# Patient Record
Sex: Female | Born: 1990 | Race: White | Hispanic: No | Marital: Single | Smoking: Never smoker
Health system: Southern US, Community
[De-identification: ages and names within clinical notes are randomized; demographics above are authoritative.]

## PROBLEM LIST (undated history)

## (undated) DIAGNOSIS — R011 Cardiac murmur, unspecified: Secondary | ICD-10-CM

## (undated) DIAGNOSIS — J45909 Unspecified asthma, uncomplicated: Secondary | ICD-10-CM

## (undated) DIAGNOSIS — R569 Unspecified convulsions: Secondary | ICD-10-CM

## (undated) DIAGNOSIS — G43909 Migraine, unspecified, not intractable, without status migrainosus: Secondary | ICD-10-CM

## (undated) HISTORY — DX: Cardiac murmur, unspecified: R01.1

## (undated) HISTORY — DX: Unspecified convulsions: R56.9

## (undated) HISTORY — DX: Unspecified asthma, uncomplicated: J45.909

## (undated) HISTORY — DX: Migraine, unspecified, not intractable, without status migrainosus: G43.909

---
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
## 2014-07-28 HISTORY — PX: WISDOM TOOTH EXTRACTION: SHX21

## 2016-01-07 DIAGNOSIS — H5213 Myopia, bilateral: Secondary | ICD-10-CM | POA: Diagnosis not present

## 2016-10-03 ENCOUNTER — Encounter: Payer: Self-pay | Admitting: Physician Assistant

## 2016-10-03 ENCOUNTER — Telehealth: Payer: Self-pay

## 2016-10-03 ENCOUNTER — Ambulatory Visit (INDEPENDENT_AMBULATORY_CARE_PROVIDER_SITE_OTHER): Payer: BLUE CROSS/BLUE SHIELD | Admitting: Physician Assistant

## 2016-10-03 VITALS — BP 115/72 | HR 88 | Ht 62.0 in | Wt 163.0 lb

## 2016-10-03 DIAGNOSIS — J4550 Severe persistent asthma, uncomplicated: Secondary | ICD-10-CM | POA: Diagnosis not present

## 2016-10-03 DIAGNOSIS — N926 Irregular menstruation, unspecified: Secondary | ICD-10-CM | POA: Diagnosis not present

## 2016-10-03 MED ORDER — FLUTICASONE PROPIONATE HFA 220 MCG/ACT IN AERO: 2.0000 | INHALATION_SPRAY | Freq: Two times a day (BID) | RESPIRATORY_TRACT | 3 refills | Status: DC

## 2016-10-03 MED ORDER — MONTELUKAST SODIUM 10 MG PO TABS: 10.0000 mg | ORAL_TABLET | Freq: Every day | ORAL | 3 refills | Status: DC

## 2016-10-03 MED ORDER — LEVALBUTEROL HCL 0.63 MG/3ML IN NEBU
0.6300 mg | INHALATION_SOLUTION | Freq: Four times a day (QID) | RESPIRATORY_TRACT | 3 refills | Status: DC | PRN
Start: 2016-10-03 — End: 2018-03-22

## 2016-10-03 MED ORDER — CYCLOBENZAPRINE HCL 10 MG PO TABS: 10.0000 mg | ORAL_TABLET | ORAL | 0 refills | Status: DC | PRN

## 2016-10-03 MED ORDER — NORETHINDRONE ACET-ETHINYL EST 1.5-30 MG-MCG PO TABS: 1.0000 | ORAL_TABLET | Freq: Every day | ORAL | 11 refills | Status: DC

## 2016-10-03 MED ORDER — ALBUTEROL SULFATE 108 (90 BASE) MCG/ACT IN AEPB: 2.0000 | INHALATION_SPRAY | RESPIRATORY_TRACT | 5 refills | Status: DC

## 2016-10-03 NOTE — Telephone Encounter (Signed)
TID as needed.   FYI I just hit refill on rx that she had previously been taking.

## 2016-10-03 NOTE — Telephone Encounter (Signed)
Pharmacy would like clarification on Flexeril prescription as to 1 how often?  Please clarify.

## 2016-10-03 NOTE — Progress Notes (Signed)
Subjective:    Patient ID: Carol [name], female    DOB: 11-07-1990, 26 y.o.   MRN: [id number]  HPI Pt is a 26 yo female who presents to the clinic to establish care.   .. Active Ambulatory Problems    Diagnosis Date Noted  . Severe persistent asthma without complication 10/03/2016  . Irregular periods/menstrual cycles 10/03/2016   Resolved Ambulatory Problems    Diagnosis Date Noted  . No Resolved Ambulatory Problems   No Additional Past Medical History   .Marland Kitchen. Family History  Problem Relation Age of Onset  . Heart murmur Mother   . Asthma Father   . Hyperlipidemia Paternal Grandmother   . Hypertension Paternal Grandmother   . Hyperlipidemia Paternal Grandfather   . Hypertension Paternal Grandfather    .Marland Kitchen. Social History   Social History  . Marital status: Unknown    Spouse name: N/A  . Number of children: N/A  . Years of education: N/A   Occupational History  . Not on file.   Social History Main Topics  . Smoking status: Never Smoker  . Smokeless tobacco: Never Used  . Alcohol use No  . Drug use: No  . Sexual activity: No   Other Topics Concern  . Not on file   Social History Narrative  . No narrative on file   Pt has no concerns but needs refills today. Moved here from [place].   Asthma- long hx with diagnoses at age 89. She has 2-3 flares a year. Triggers are scents, cleaners, dust. On flovent daily. Seems to have worked better than anything. She has tried advair in the past. She is doing great today.   Irregular periods- she is a virgin and would like to not have pap until she is sexually active. She is on OCP to regulate periods.    Review of Systems  All other systems reviewed and are negative.      Objective:   Physical Exam  Constitutional: She is oriented to person, place, and time. She appears well-developed and well-nourished.  HENT:  Head: Normocephalic and atraumatic.  Neck: Normal range of motion. Neck supple.  Cardiovascular: Normal  rate, regular rhythm and normal heart sounds.   Pulmonary/Chest: Effort normal and breath sounds normal. She has no wheezes.  Lymphadenopathy:    She has no cervical adenopathy.  Neurological: She is alert and oriented to person, place, and time.  Psychiatric: She has a normal mood and affect. Her behavior is normal.          Assessment & Plan:  Marland Kitchen.Marland Kitchen.Carol [name] was seen today for establish care and asthma.  Diagnoses and all orders for this visit:  Severe persistent asthma without complication -     cyclobenzaprine (FLEXERIL) 10 MG tablet; Take 1 tablet (10 mg total) by mouth as needed for muscle spasms. -     fluticasone (FLOVENT HFA) 220 MCG/ACT inhaler; Inhale 2 puffs into the lungs 2 (two) times daily. -     levalbuterol (XOPENEX) 0.63 MG/3ML nebulizer solution; Inhale 3 mLs (0.63 mg total) into the lungs every 6 (six) hours as needed for wheezing or shortness of breath. -     Albuterol Sulfate 108 (90 Base) MCG/ACT AEPB; Inhale 2 puffs into the lungs every 4 (four) hours. -     montelukast (SINGULAIR) 10 MG tablet; Take 1 tablet (10 mg total) by mouth at bedtime.  Irregular periods/menstrual cycles -     Norethindrone Acetate-Ethinyl Estradiol (JUNEL 1.5/30) 1.5-30 MG-MCG tablet; Take 1 tablet by  mouth daily.   Medications refills. Follow up as needed. Encouraged to find pulmonologist to also help manage asthma.

## 2016-10-03 NOTE — Telephone Encounter (Signed)
Ok for 

## 2016-10-03 NOTE — Telephone Encounter (Signed)
Pharmacy called and wanted a clarification on pts xopenex.  For the amount, only 3 mls were ordered.  Please clarify.

## 2016-12-23 DIAGNOSIS — Z713 Dietary counseling and surveillance: Secondary | ICD-10-CM | POA: Diagnosis not present

## 2017-02-24 DIAGNOSIS — Z713 Dietary counseling and surveillance: Secondary | ICD-10-CM | POA: Diagnosis not present

## 2017-04-01 DIAGNOSIS — J45901 Unspecified asthma with (acute) exacerbation: Secondary | ICD-10-CM | POA: Diagnosis not present

## 2017-04-06 DIAGNOSIS — R05 Cough: Secondary | ICD-10-CM | POA: Diagnosis not present

## 2017-04-06 DIAGNOSIS — J069 Acute upper respiratory infection, unspecified: Secondary | ICD-10-CM | POA: Diagnosis not present

## 2017-04-06 DIAGNOSIS — J45901 Unspecified asthma with (acute) exacerbation: Secondary | ICD-10-CM | POA: Diagnosis not present

## 2017-04-07 ENCOUNTER — Ambulatory Visit (INDEPENDENT_AMBULATORY_CARE_PROVIDER_SITE_OTHER): Payer: BLUE CROSS/BLUE SHIELD

## 2017-04-07 ENCOUNTER — Encounter: Payer: Self-pay | Admitting: Physician Assistant

## 2017-04-07 ENCOUNTER — Ambulatory Visit (INDEPENDENT_AMBULATORY_CARE_PROVIDER_SITE_OTHER): Payer: BLUE CROSS/BLUE SHIELD | Admitting: Physician Assistant

## 2017-04-07 VITALS — BP 137/79 | HR 115 | Temp 98.3°F | Wt 164.0 lb

## 2017-04-07 DIAGNOSIS — J4551 Severe persistent asthma with (acute) exacerbation: Secondary | ICD-10-CM

## 2017-04-07 DIAGNOSIS — R05 Cough: Secondary | ICD-10-CM | POA: Diagnosis not present

## 2017-04-07 DIAGNOSIS — J4550 Severe persistent asthma, uncomplicated: Secondary | ICD-10-CM | POA: Diagnosis not present

## 2017-04-07 DIAGNOSIS — M62838 Other muscle spasm: Secondary | ICD-10-CM

## 2017-04-07 DIAGNOSIS — R0602 Shortness of breath: Secondary | ICD-10-CM

## 2017-04-07 DIAGNOSIS — R059 Cough, unspecified: Secondary | ICD-10-CM

## 2017-04-07 MED ORDER — PREDNISONE 10 MG PO TABS
ORAL_TABLET | ORAL | 0 refills | Status: DC
Start: 2017-04-07 — End: 2017-06-04

## 2017-04-07 MED ORDER — CYCLOBENZAPRINE HCL 10 MG PO TABS: 10.0000 mg | ORAL_TABLET | ORAL | 0 refills | Status: DC | PRN

## 2017-04-07 MED ORDER — IPRATROPIUM-ALBUTEROL 0.5-2.5 (3) MG/3ML IN SOLN
3.0000 mL | RESPIRATORY_TRACT | 1 refills | Status: DC | PRN
Start: 2017-04-07 — End: 2019-06-27

## 2017-04-07 NOTE — Progress Notes (Signed)
Call pt: CXR normal. Continue with treatment plan discussed in office.

## 2017-04-07 NOTE — Progress Notes (Signed)
   Subjective:    Patient ID: Carol [name], female    DOB: 1991/02/22, 26 y.o.   MRN: [id number]  HPI  Pt is a 26 yo female who presents for follow up after asthma exacerbation last Tuesday and coming in contact with perfume. She has had to have 2 UC appts and had multiple duoneb treatments and been on prednisone 40mg  daily. She continues to take flovent daily. Last neb was at noon today. She is feeling better. No ear pain, ST, sinus pressure. Cough is dry. She also has chest spasms after a coughing spell. She uses flexeril for these. She needs refill.  She is doing much better today.   .. Active Ambulatory Problems    Diagnosis Date Noted  . Severe persistent asthma without complication 10/03/2016  . Irregular periods/menstrual cycles 10/03/2016   Resolved Ambulatory Problems    Diagnosis Date Noted  . No Resolved Ambulatory Problems   Past Medical History:  Diagnosis Date  . Asthma   . Heart murmur   . Seizures (HCC)        Review of Systems See HPI.     Objective:   Physical Exam  Constitutional: She is oriented to person, place, and time. She appears well-developed and well-nourished.  Cardiovascular: Regular rhythm and normal heart sounds.   Tachycardia 115.   Pulmonary/Chest: Effort normal and breath sounds normal. No respiratory distress. She has no wheezes. She has no rales. She exhibits no tenderness.  Decreased breath sounds.   Neurological: She is alert and oriented to person, place, and time.  Psychiatric: She has a normal mood and affect. Her behavior is normal.          Assessment & Plan:  Marland Kitchen.Marland Kitchen.Joni Reiningicole was seen today for asthma.  Diagnoses and all orders for this visit:  Severe persistent asthma with acute exacerbation -     ipratropium-albuterol (DUONEB) 0.5-2.5 (3) MG/3ML SOLN; Take 3 mLs by nebulization every 2 (two) hours as needed. -     predniSONE (DELTASONE) 10 MG tablet; Take 3 tablets for 3 days, take 2 tablets for 3 days, take 1 tablet for 3  days, take 1/2 tablet for 4 days. -     DG Chest 2 View  Cough -     DG Chest 2 View  Severe persistent asthma without complication  Muscle spasm -     cyclobenzaprine (FLEXERIL) 10 MG tablet; Take 1 tablet (10 mg total) by mouth as needed for muscle spasms.  pulse ox 100 percent.   CXR stat negative to confirm no concomitant infection.  Given duoneb solution to use in nebulizer.  Given prednisone taper to come off of it.  Continue to use flovent/singulair/zyrtec.  Delsym for cough/cough drops.   Follow up as needed.

## 2017-04-08 ENCOUNTER — Ambulatory Visit: Payer: BLUE CROSS/BLUE SHIELD | Admitting: Physician Assistant

## 2017-04-09 DIAGNOSIS — Z713 Dietary counseling and surveillance: Secondary | ICD-10-CM | POA: Diagnosis not present

## 2017-04-13 ENCOUNTER — Ambulatory Visit: Payer: BLUE CROSS/BLUE SHIELD | Admitting: Physician Assistant

## 2017-04-29 ENCOUNTER — Other Ambulatory Visit: Payer: Self-pay | Admitting: Physician Assistant

## 2017-04-29 DIAGNOSIS — J4550 Severe persistent asthma, uncomplicated: Secondary | ICD-10-CM

## 2017-05-25 ENCOUNTER — Other Ambulatory Visit: Payer: Self-pay | Admitting: Physician Assistant

## 2017-05-25 DIAGNOSIS — J4550 Severe persistent asthma, uncomplicated: Secondary | ICD-10-CM

## 2017-05-27 NOTE — Telephone Encounter (Signed)
Yes on flexeril long term. She uses it intermittently during flares of asthma.

## 2017-05-27 NOTE — Telephone Encounter (Signed)
Did you want patient to take medication long term? Please advise.

## 2017-06-04 ENCOUNTER — Encounter: Payer: Self-pay | Admitting: Physician Assistant

## 2017-06-04 ENCOUNTER — Ambulatory Visit (INDEPENDENT_AMBULATORY_CARE_PROVIDER_SITE_OTHER): Payer: BLUE CROSS/BLUE SHIELD | Admitting: Physician Assistant

## 2017-06-04 VITALS — BP 125/79 | HR 82 | Ht 62.0 in | Wt 162.0 lb

## 2017-06-04 DIAGNOSIS — J4550 Severe persistent asthma, uncomplicated: Secondary | ICD-10-CM

## 2017-06-04 MED ORDER — PREDNISONE 10 MG PO TABS
ORAL_TABLET | ORAL | 0 refills | Status: DC
Start: 2017-06-04 — End: 2017-06-29

## 2017-06-04 NOTE — Progress Notes (Signed)
   Subjective:    Patient ID: Carol [name], female    DOB: [birth year], 26 y.o.   MRN: [id number]  HPI  Patient is a 26 year old female with a history of severe persistent asthma who comes to the clinic complaining of episodes of shortness of breath and wheezing that resolved with Xopenex.  What concerns her is that her pulse ox seems to drop substantially during these episodes. She reports 2-3 episodes for last 2 weeks. She is not aware of any typical triggers. She has noticed that it is always around 11am. She reports via her own pulse ox meter dropped from 99% to 82% with an 1 minute accompanied by a pale color change and severe dizziness.  She does have friends who could support these claims.  She had an episode today at 7711 that resolved quickly with Xopenex.  She denies any fever, chills, headache, ear pain or productive cough.  She remains on Flovent for prevention.  Episodes have been happening for the last 2 weeks.  She does not currently have a pulmonologist. She is using mucinex daily due to some sinus drainage.   . Active Ambulatory Problems    Diagnosis Date Noted  . Severe persistent asthma without complication 10/03/2016  . Irregular periods/menstrual cycles 10/03/2016   Resolved Ambulatory Problems    Diagnosis Date Noted  . No Resolved Ambulatory Problems   Past Medical History:  Diagnosis Date  . Asthma   . Heart murmur   . Seizures (HCC)     Review of Systems  All other systems reviewed and are negative.      Objective:   Physical Exam  Constitutional: She is oriented to person, place, and time. She appears well-developed and well-nourished.  HENT:  Head: Normocephalic and atraumatic.  Right Ear: External ear normal.  Left Ear: External ear normal.  Eyes: Conjunctivae are normal.  Neck: Normal range of motion. Neck supple.  Cardiovascular: Normal rate and regular rhythm.  Murmur heard. 2/6 SEM  Pulmonary/Chest: Effort normal and breath sounds normal. She has  no wheezes.  Lymphadenopathy:    She has no cervical adenopathy.  Neurological: She is alert and oriented to person, place, and time.  Psychiatric: She has a normal mood and affect. Her behavior is normal.          Assessment & Plan:  Marland Kitchen.Marland Kitchen.Carol [name] was seen today for asthma.  Diagnoses and all orders for this visit:  Severe persistent asthma without complication -     predniSONE (DELTASONE) 10 MG tablet; Take 3 tablets for 3 days, take 2 tablets for 3 days, take 1 tablet for 3 days, and 1/2 tablet for 4 days. -     Ambulatory referral to Pulmonology   Discussed with patient certainly she does have a history of asthma and Flovent has worked to keep her exacerbations down.  It is unclear what is causing this sudden D-Stat and her pulse oximetry.  Since this is happening more frequently I will treat her with a course of steroids.  I will make a pulmonology referral for her to likely do more testing and see if there is anything else she can do during exacerbations order to decrease exacerbations. Certainly stay on all antihistamines and Flovent for maintenance.  Continue to use Xopenex alternating with DuoNeb 4 exacerbations. Reassured patient I saw no signs of infection today.

## 2017-06-29 ENCOUNTER — Encounter: Payer: Self-pay | Admitting: Emergency Medicine

## 2017-06-29 ENCOUNTER — Ambulatory Visit: Payer: BLUE CROSS/BLUE SHIELD | Admitting: Emergency Medicine

## 2017-06-29 DIAGNOSIS — J4550 Severe persistent asthma, uncomplicated: Secondary | ICD-10-CM | POA: Diagnosis not present

## 2017-06-29 NOTE — Assessment & Plan Note (Signed)
History of lifelong asthma, stable at this time but with several exacerbations a year.  Most recent PFT 2015.  I will continue her current regimen for now, may decide to adjust depending on frequency of flares and characteristics of a flare.  I would like to see her if she does develop wheezing, dyspnea.  She has had the Pneumovax, flu shot  Please continue your Flovent twice a day as you have been taking it. Remember to rinse and gargle after using.  Continue Singulair each evening.  Keep albuterol available to use 2 puffs as needed Follow your peak flows to keep track of your every day breathing status.  Flu shot up to date.  Follow with Dr Delton CoombesByrum in 6 months or sooner if you have any problems

## 2017-06-29 NOTE — Progress Notes (Signed)
Subjective:    Patient ID: Carol [name], female    DOB: [birth year], 26 y.o.   MRN: [id number]  HPI 26 year old never smoker with a history of asthma that was diagnosed in childhood. Currently managed on ICS + Singulair, albuterol prn. Also has xopenex nebs prn. Uses albuterol about once a week. She has formerly been on Advair. She probably averages 2-3 flares a year. September 2018 most recent.   Allergies to dust mites, birds / feathers, cats, dogs, eucalyptus, molds / pollens   Triggers:  Perfumes, scented candles, etc. Cleaning solutions. Smoke. Cold air.   [facility]: Normal airflows, FEV1 3.15 L (96% predicted)  Review of Systems  Constitutional: Negative for fever and unexpected weight change.  HENT: Negative for congestion, dental problem, ear pain, nosebleeds, postnasal drip, rhinorrhea, sinus pressure, sneezing, sore throat and trouble swallowing.   Eyes: Negative for redness and itching.  Respiratory: Positive for chest tightness and shortness of breath. Negative for cough and wheezing.   Cardiovascular: Negative for palpitations and leg swelling.  Gastrointestinal: Negative for nausea and vomiting.  Genitourinary: Negative for dysuria.  Musculoskeletal: Negative for joint swelling.  Skin: Negative for rash.  Neurological: Positive for headaches.  Hematological: Does not bruise/bleed easily.  Psychiatric/Behavioral: Negative for dysphoric mood. The patient is not nervous/anxious.     Past Medical History:  Diagnosis Date  . Asthma   . Heart murmur   . Seizures (HCC)      Family History  Problem Relation Age of Onset  . Heart murmur Mother   . Asthma Father   . Hyperlipidemia Paternal Grandmother   . Hypertension Paternal Grandmother   . Hyperlipidemia Paternal Grandfather   . Hypertension Paternal Grandfather      Social History   Socioeconomic History  . Marital status: Unknown    Spouse name: Not on file  . Number of children: Not on file   . Years of education: Not on file  . Highest education level: Not on file  Social Needs  . Financial resource strain: Not on file  . Food insecurity - worry: Not on file  . Food insecurity - inability: Not on file  . Transportation needs - medical: Not on file  . Transportation needs - non-medical: Not on file  Occupational History  . Not on file  Tobacco Use  . Smoking status: Never Smoker  . Smokeless tobacco: Never Used  Substance and Sexual Activity  . Alcohol use: No  . Drug use: No  . Sexual activity: No  Other Topics Concern  . Not on file  Social History Narrative  . Not on file    Works as a professor at [employer].   Allergies  Allergen Reactions  . Cephalosporins Hives  . Ethyl Alcohol (Skin Cleanser) Anaphylaxis    Brings on asthma  . Latex Dermatitis  . Other Other (See Comments)    Pt with local muscular induration, syncope, fever, unclear origin as given at same time as suspected tick illness and doxy script     Outpatient Medications Prior to Visit  Medication Sig Dispense Refill  . Albuterol Sulfate 108 (90 Base) MCG/ACT AEPB Inhale 2 puffs into the lungs every 4 (four) hours. 1 each 5  . cyclobenzaprine (FLEXERIL) 10 MG tablet TAKE 1 TABLET BY MOUTH THREE TIMES DAILY AS NEEDED MUSCLE  SPASMS 30 tablet 5  . Fexofenadine HCl (ALLEGRA PO) Take by mouth daily.    Marland Kitchen. FLOVENT HFA 220 MCG/ACT inhaler 2 PUFFS INHALE TWICE  A DAY FOR ASTHMA CONTROL 12 Inhaler 0  . ipratropium-albuterol (DUONEB) 0.5-2.5 (3) MG/3ML SOLN Take 3 mLs by nebulization every 2 (two) hours as needed. 360 mL 1  . IRON PO Take by mouth every other day.    . levalbuterol (XOPENEX) 0.63 MG/3ML nebulizer solution Inhale 3 mLs (0.63 mg total) into the lungs every 6 (six) hours as needed for wheezing or shortness of breath. 3 mL 3  . montelukast (SINGULAIR) 10 MG tablet Take 1 tablet (10 mg total) by mouth at bedtime. 90 tablet 3  . Multiple Vitamin (MULTIVITAMIN) capsule Take by mouth.    .  Norethindrone Acetate-Ethinyl Estradiol (JUNEL 1.5/30) 1.5-30 MG-MCG tablet Take 1 tablet by mouth daily. 1 Package 11  . predniSONE (DELTASONE) 10 MG tablet Take 3 tablets for 3 days, take 2 tablets for 3 days, take 1 tablet for 3 days, and 1/2 tablet for 4 days. 20 tablet 0   No facility-administered medications prior to visit.         Objective:   Physical Exam Vitals:   06/29/17 0847  BP: 108/60  Pulse: 82  SpO2: 100%  Weight: 162 lb 9.6 oz (73.8 kg)  Height: 5\' 2"  (1.575 m)   Gen: Pleasant, well-nourished, in no distress,  normal affect  ENT: No lesions,  mouth clear,  oropharynx clear, no postnasal drip  Neck: No JVD, no TMG, no carotid bruits  Lungs: No use of accessory muscles, no dullness to percussion, clear without rales or rhonchi  Cardiovascular: RRR, heart sounds normal, no murmur or gallops, no peripheral edema  Musculoskeletal: No deformities, no cyanosis or clubbing  Neuro: alert, non focal  Skin: Warm, no lesions or rashes      Assessment & Plan:  Severe persistent asthma without complication History of lifelong asthma, stable at this time but with several exacerbations a year.  Most recent PFT 2015.  I will continue her current regimen for now, may decide to adjust depending on frequency of flares and characteristics of a flare.  I would like to see her if she does develop wheezing, dyspnea.  She has had the Pneumovax, flu shot  Please continue your Flovent twice a day as you have been taking it. Remember to rinse and gargle after using.  Continue Singulair each evening.  Keep albuterol available to use 2 puffs as needed Follow your peak flows to keep track of your every day breathing status.  Flu shot up to date.  Follow with Dr Delton CoombesByrum in 6 months or sooner if you have any problems  Levy Pupaobert Lakayla Barrington, MD, PhD 06/29/2017, 9:38 AM [facility] [phone] or if no answer [phone]

## 2017-06-29 NOTE — Patient Instructions (Addendum)
Please continue your Flovent twice a day as you have been taking it. Remember to rinse and gargle after using.  Continue Singulair each evening.  Keep albuterol available to use 2 puffs as needed Follow your peak flows to keep track of your every day breathing status.  Flu shot up to date.  Follow with Dr Delton CoombesByrum in 6 months or sooner if you have any problems

## 2017-08-20 DIAGNOSIS — H5213 Myopia, bilateral: Secondary | ICD-10-CM | POA: Diagnosis not present

## 2017-10-06 ENCOUNTER — Other Ambulatory Visit: Payer: Self-pay | Admitting: Physician Assistant

## 2017-10-06 DIAGNOSIS — N926 Irregular menstruation, unspecified: Secondary | ICD-10-CM

## 2017-11-03 DIAGNOSIS — J4521 Mild intermittent asthma with (acute) exacerbation: Secondary | ICD-10-CM | POA: Diagnosis not present

## 2017-11-05 ENCOUNTER — Other Ambulatory Visit: Payer: Self-pay | Admitting: Physician Assistant

## 2017-11-05 DIAGNOSIS — J4550 Severe persistent asthma, uncomplicated: Secondary | ICD-10-CM

## 2017-11-10 ENCOUNTER — Encounter: Payer: Self-pay | Admitting: Physician Assistant

## 2017-11-10 ENCOUNTER — Ambulatory Visit: Payer: BLUE CROSS/BLUE SHIELD | Admitting: Physician Assistant

## 2017-11-10 VITALS — BP 136/76 | HR 99 | Ht 62.0 in | Wt 165.0 lb

## 2017-11-10 DIAGNOSIS — Z130 Encounter for screening for diseases of the blood and blood-forming organs and certain disorders involving the immune mechanism: Secondary | ICD-10-CM | POA: Diagnosis not present

## 2017-11-10 DIAGNOSIS — Z131 Encounter for screening for diabetes mellitus: Secondary | ICD-10-CM

## 2017-11-10 DIAGNOSIS — J4551 Severe persistent asthma with (acute) exacerbation: Secondary | ICD-10-CM

## 2017-11-10 DIAGNOSIS — Z13220 Encounter for screening for lipoid disorders: Secondary | ICD-10-CM | POA: Diagnosis not present

## 2017-11-10 MED ORDER — PREDNISONE 10 MG PO TABS
ORAL_TABLET | ORAL | 0 refills | Status: DC
Start: 2017-11-10 — End: 2017-12-22

## 2017-11-10 NOTE — Progress Notes (Signed)
Subjective:    Patient ID: Carol [name], female    DOB: [birth year], 27 y.o.   MRN: [id number]  HPI Carol [name] presents today for a follow up regarding an asthma flare up. On 11/03/17 she was seen at [facility] for an asthma flare up. 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 She states she has triggers like pollen, perfume/cologne, etc. She has been taking DuoNeb treatments every 4 hours at home in which she is starting to feel better. She was also given a tapering dose of prednisone (currently still on prednisone ~5 days left). She also takes Singulair, as well as Allegra (has taken daily for the past year). She also has Xopenex and is currently want to know which she should be taking more (DuoNeb or Xopenex).   She states she is feeling better than she did at the beginning of the exacerbation but not as good as she normally does after initiating treatment for an exacerbation. She has been wearing a mask when she goes outside and changes clothes to mitigate the pollen.   She has been keeping log of her peak flow meter and her highest number has been ~410 and on a good day she is >500.   .. Active Ambulatory Problems    Diagnosis Date Noted  . Severe persistent asthma without complication 10/03/2016  . Irregular periods/menstrual cycles 10/03/2016   Resolved Ambulatory Problems    Diagnosis Date Noted  . No Resolved Ambulatory Problems   Past Medical History:  Diagnosis Date  . Asthma   . Heart murmur   . Seizures (HCC)       Review of Systems  Constitutional: Negative for activity change, fatigue and fever.  HENT: Positive for congestion. Negative for rhinorrhea, sinus pressure and sinus pain.   Respiratory: Positive for shortness of breath and wheezing.   Cardiovascular: Negative for chest pain and palpitations.  Neurological: Negative for dizziness, syncope and  headaches.       Objective:   Physical Exam  Constitutional: She is oriented to person, place, and time. She appears well-developed and well-nourished.  HENT:  Head: Normocephalic and atraumatic.  Eyes: Conjunctivae are normal.  Neck: Normal range of motion.  Cardiovascular: Normal rate and regular rhythm.  Murmur ( 2/6 SEM) heard. Pulmonary/Chest: Effort normal and breath sounds normal. No respiratory distress. She has no wheezes.  Musculoskeletal: Normal range of motion.  Neurological: She is alert and oriented to person, place, and time.  Skin: Skin is warm and dry.  Psychiatric: She has a normal mood and affect. Her behavior is normal.  Nursing note and vitals reviewed.         Assessment & Plan:  Marland KitchenMarland KitchenDiagnoses and all orders for this visit:  Severe persistent asthma with acute exacerbation -     predniSONE (DELTASONE) 10 MG tablet; Take 2 tablets for 5 days, then 1 tablet for 5 days, then 1/2 tablet for 4 days.  Screening for lipid disorders -     Lipid Panel w/reflex Direct LDL  Screening for diabetes mellitus -     COMPLETE METABOLIC PANEL WITH GFR  Screening for deficiency anemia -     CBC -     Fe+TIBC+Fer   Pt is improving. Decided to extend prednisone taper for a few more days due to being in the middle of allergy season. Continue duoneb and xopenex  but I certainly would use more xopenex since more tolerable.   Pt is a vegetarian will check cbc/ferritin.  Screening labs ordered.

## 2017-11-27 ENCOUNTER — Ambulatory Visit: Payer: BLUE CROSS/BLUE SHIELD | Admitting: Emergency Medicine

## 2017-12-17 ENCOUNTER — Emergency Department (INDEPENDENT_AMBULATORY_CARE_PROVIDER_SITE_OTHER)
Admission: EM | Admit: 2017-12-17 | Discharge: 2017-12-17 | Disposition: A | Discharge status: Home / Self Care | Payer: BLUE CROSS/BLUE SHIELD | Source: Home / Self Care

## 2017-12-17 ENCOUNTER — Telehealth: Payer: Self-pay

## 2017-12-17 DIAGNOSIS — R7309 Other abnormal glucose: Secondary | ICD-10-CM | POA: Diagnosis not present

## 2017-12-17 LAB — POCT FASTING CBG KUC MANUAL ENTRY: POCT Glucose (KUC): 132 mg/dL — AB (ref 70–99)

## 2017-12-17 NOTE — Telephone Encounter (Signed)
Pt called today to get appt scheduled for what she thinks was some low blood sugar episodes that she has been having. Pt states that she has not been feeling well and had been out of work some days.   Pt was scheduled to see Lesly Rubenstein 01-23-18 but I advised her to get an OTC meter and check her blood sugar to see if that was the issue.   Pt called me back stating that she checked her blood sugar and it was 35. She took blood sugar twice to confirm and received the same reading.   I called pt back and she retook blood sugar on the phone with me and it was 15 minutes post eating. Rechecked sugar and meter read "low" and could not give her a number.   Pt advised to have someone take her to hospital to be treated. Pt agreeable and will go now to hospital. I left pt scheduled for her appt with Jade on 01-23-18.  FYI to [facility]

## 2017-12-17 NOTE — Telephone Encounter (Signed)
Pt came in to UC and BS was checked on UC meter by K. Orson Slick, Charity fundraiser.  BG was 132.  Also checked on pt's meter and it was 102.  Pt going to get a new meter.  She was instructed on the signs of emergent visit.  Pt left understanding of when to seek immediate care.

## 2017-12-22 ENCOUNTER — Ambulatory Visit: Payer: BLUE CROSS/BLUE SHIELD | Admitting: Emergency Medicine

## 2017-12-22 ENCOUNTER — Encounter: Payer: Self-pay | Admitting: Emergency Medicine

## 2017-12-22 DIAGNOSIS — J4550 Severe persistent asthma, uncomplicated: Secondary | ICD-10-CM

## 2017-12-22 NOTE — Patient Instructions (Signed)
Please continue your medications as you have been taking them  We will follow in September and plan to do blood work at that time.

## 2017-12-22 NOTE — Assessment & Plan Note (Signed)
She had a severe but typical flare in the beginning of the spring allergy season.  She required a prolonged course of prednisone, is now back to her usual baseline clinical status and peak flow measurement.  We will continue her Flovent, Singulair, Allegra.  She uses albuterol as needed.  I think will be reasonable given the severity of her symptoms when she flares to check an IgE and eosinophil count to see if she is a candidate for targeted therapy.  I like to do this after she is been off prednisone for a few months.  Depending on the results we can consider immunotherapy.

## 2017-12-22 NOTE — Progress Notes (Signed)
   Subjective:    Patient ID: Carol [name], female    DOB: [birth year], 27 y.o.   MRN: [id number]  HPI 27 year old never smoker with a history of asthma that was diagnosed in childhood. Currently managed on ICS + Singulair, albuterol prn. Also has xopenex nebs prn. Uses albuterol about once a week. She has formerly been on Advair. She probably averages 2-3 flares a year. September 2018 most recent.   Allergies to dust mites, birds / feathers, cats, dogs, eucalyptus, molds / pollens   Triggers:  Perfumes, scented candles, etc. Cleaning solutions. Smoke. Cold air.   Last PFT 07/2013 at [facility]: Normal airflows, FEV1 3.15 L (96% predicted)   ROV 12/22/17 --follow-up visit for history of asthma.  Currently managed on Flovent twice a day, Singulair.  She uses albuterol approximately.  She is on Allegra.  She noted an increase in when the pollen started this Spring, had a decrease in her peak-flows, chest tightness. No change in mucous or cough, sneezing controlled. She did hear some wheeze. She went to Urgent Care, was treated with pred taper, with improvement but not back to baseline. The taper was extended and she improved, about 3.5 weeks total. Peak flow baseline 470 (baseline).     Review of Systems  Constitutional: Negative for fever and unexpected weight change.  HENT: Negative for congestion, dental problem, ear pain, nosebleeds, postnasal drip, rhinorrhea, sinus pressure, sneezing, sore throat and trouble swallowing.   Eyes: Negative for redness and itching.  Respiratory: Positive for chest tightness and shortness of breath. Negative for cough and wheezing.   Cardiovascular: Negative for palpitations and leg swelling.  Gastrointestinal: Negative for nausea and vomiting.  Genitourinary: Negative for dysuria.  Musculoskeletal: Negative for joint swelling.  Skin: Negative for rash.  Neurological: Positive for headaches.  Hematological: Does not bruise/bleed easily.  Psychiatric/Behavioral:  Negative for dysphoric mood. The patient is not nervous/anxious.         Objective:   Physical Exam Vitals:   12/22/17 1558  BP: 130/80  Pulse: 82  SpO2: 100%  Weight: 169 lb 9.6 oz (76.9 kg)  Height:  (1.575 m)   Gen: Pleasant, well-nourished, in no distress,  normal affect  ENT: No lesions,  mouth clear,  oropharynx clear, no postnasal drip  Neck: No JVD, no stridor  Lungs: No use of accessory muscles, no whezes  Cardiovascular: RRR, heart sounds normal, no murmur or gallops, no peripheral edema  Musculoskeletal: No deformities, no cyanosis or clubbing  Neuro: alert, non focal  Skin: Warm, no lesions or rashes      Assessment & Plan:  Severe persistent asthma without complication She had a severe but typical flare in the beginning of the spring allergy season.  She required a prolonged course of prednisone, is now back to her usual baseline clinical status and peak flow measurement.  We will continue her Flovent, Singulair, Allegra.  She uses albuterol as needed.  I think will be reasonable given the severity of her symptoms when she flares to check an IgE and eosinophil count to see if she is a candidate for targeted therapy.  I like to do this after she is been off prednisone for a few months.  Depending on the results we can consider immunotherapy.    Levy Pupa, MD, PhD 12/22/2017, 4:35 PM [facility] [phone] or if no answer [phone]

## 2017-12-23 ENCOUNTER — Ambulatory Visit: Payer: BLUE CROSS/BLUE SHIELD | Admitting: Physician Assistant

## 2017-12-30 ENCOUNTER — Encounter: Payer: Self-pay | Admitting: Physician Assistant

## 2017-12-30 ENCOUNTER — Ambulatory Visit: Payer: BLUE CROSS/BLUE SHIELD | Admitting: Physician Assistant

## 2017-12-30 VITALS — BP 128/66 | HR 59 | Wt 170.4 lb

## 2017-12-30 DIAGNOSIS — E162 Hypoglycemia, unspecified: Secondary | ICD-10-CM

## 2017-12-30 DIAGNOSIS — Z13220 Encounter for screening for lipoid disorders: Secondary | ICD-10-CM

## 2017-12-30 DIAGNOSIS — Z131 Encounter for screening for diabetes mellitus: Secondary | ICD-10-CM | POA: Diagnosis not present

## 2017-12-30 DIAGNOSIS — R5383 Other fatigue: Secondary | ICD-10-CM | POA: Diagnosis not present

## 2017-12-30 DIAGNOSIS — R110 Nausea: Secondary | ICD-10-CM | POA: Diagnosis not present

## 2017-12-30 NOTE — Progress Notes (Signed)
   Subjective:    Patient ID: Carol [name], female    DOB: June 12, 1991, 27 y.o.   MRN: [id number]  HPI  Pt is a 27 yo pleasant female with asthma and hx of seizures who presents to the clinic with periods of dizziness, nausea and low blood sugar. She has a family hx of hypoglycemia. She is checking her sugars during episodes and reports as low as 50's when feeling bad. Eating does improve her symptoms but does take some time. Overall low energy. She is sleeping well but she does nap often. Episodes last 15minutes.   Overall her breathing, wheezing, SOB is doing well. She also see pulmology.   .. Active Ambulatory Problems    Diagnosis Date Noted  . Severe persistent asthma without complication 10/03/2016  . Irregular periods/menstrual cycles 10/03/2016  . Nausea 01/01/2018  . Low energy 01/01/2018   Resolved Ambulatory Problems    Diagnosis Date Noted  . No Resolved Ambulatory Problems   Past Medical History:  Diagnosis Date  . Asthma   . Heart murmur   . Seizures (HCC)      Review of Systems  All other systems reviewed and are negative.      Objective:   Physical Exam  Constitutional: She is oriented to person, place, and time. She appears well-developed and well-nourished.  HENT:  Head: Normocephalic and atraumatic.  Neck: Normal range of motion. Neck supple. No thyromegaly present.  Cardiovascular: Normal rate and regular rhythm.  Pulmonary/Chest: Effort normal and breath sounds normal.  Abdominal: Soft. Bowel sounds are normal. She exhibits no distension and no mass. There is no tenderness.  Neurological: She is alert and oriented to person, place, and time.  Psychiatric: She has a normal mood and affect. Her behavior is normal.          Assessment & Plan:  Marland Kitchen.Marland Kitchen.Diagnoses and all orders for this visit:  Low energy -     TSH -     CBC with Differential/Platelet -     Fe+TIBC+Fer -     Vitamin D 1,25 dihydroxy -     B12 -     Hemoglobin A1c  Screening for  diabetes mellitus -     COMPLETE METABOLIC PANEL WITH GFR  Screening for lipid disorders -     Lipid Panel w/reflex Direct LDL  Low blood sugar -     COMPLETE METABOLIC PANEL WITH GFR -     Hemoglobin A1c  Nausea -     TSH -     CBC with Differential/Platelet -     Fe+TIBC+Fer -     Vitamin D 1,25 dihydroxy -     B12   Symptoms sound consistent with hypoglycemia. Eat small frequent meals. Continue to monitor sugars. Will get labs and a!C. Will look into other causes of fatigue and nausea.

## 2018-01-01 ENCOUNTER — Encounter: Payer: Self-pay | Admitting: Physician Assistant

## 2018-01-01 DIAGNOSIS — R110 Nausea: Secondary | ICD-10-CM | POA: Diagnosis not present

## 2018-01-01 DIAGNOSIS — R5383 Other fatigue: Secondary | ICD-10-CM | POA: Diagnosis not present

## 2018-01-01 DIAGNOSIS — E162 Hypoglycemia, unspecified: Secondary | ICD-10-CM | POA: Diagnosis not present

## 2018-01-01 DIAGNOSIS — Z131 Encounter for screening for diabetes mellitus: Secondary | ICD-10-CM | POA: Diagnosis not present

## 2018-01-01 DIAGNOSIS — Z13220 Encounter for screening for lipoid disorders: Secondary | ICD-10-CM | POA: Diagnosis not present

## 2018-01-01 DIAGNOSIS — Z1321 Encounter for screening for nutritional disorder: Secondary | ICD-10-CM | POA: Diagnosis not present

## 2018-01-03 LAB — CBC WITH DIFFERENTIAL/PLATELET
BASOS PCT: 0.6 %
Basophils Absolute: 31 cells/uL (ref 0–200)
EOS PCT: 4.1 %
Eosinophils Absolute: 209 cells/uL (ref 15–500)
HCT: 40.3 % (ref 35.0–45.0)
Hemoglobin: 14 g/dL (ref 11.7–15.5)
LYMPHS ABS: 2162 {cells}/uL (ref 850–3900)
MCH: 30.5 pg (ref 27.0–33.0)
MCHC: 34.7 g/dL (ref 32.0–36.0)
MCV: 87.8 fL (ref 80.0–100.0)
MPV: 9.6 fL (ref 7.5–12.5)
Monocytes Relative: 8.6 %
Neutro Abs: 2259 cells/uL (ref 1500–7800)
Neutrophils Relative %: 44.3 %
PLATELETS: 308 10*3/uL (ref 140–400)
RBC: 4.59 10*6/uL (ref 3.80–5.10)
RDW: 11.5 % (ref 11.0–15.0)
TOTAL LYMPHOCYTE: 42.4 %
WBC: 5.1 10*3/uL (ref 3.8–10.8)
WBCMIX: 439 {cells}/uL (ref 200–950)

## 2018-01-03 LAB — COMPLETE METABOLIC PANEL WITH GFR
AG Ratio: 1.6 (calc) (ref 1.0–2.5)
ALBUMIN MSPROF: 4.4 g/dL (ref 3.6–5.1)
ALT: 37 U/L — AB (ref 6–29)
AST: 24 U/L (ref 10–30)
Alkaline phosphatase (APISO): 64 U/L (ref 33–115)
BUN: 10 mg/dL (ref 7–25)
CO2: 26 mmol/L (ref 20–32)
CREATININE: 0.73 mg/dL (ref 0.50–1.10)
Calcium: 9.5 mg/dL (ref 8.6–10.2)
Chloride: 102 mmol/L (ref 98–110)
GFR, Est African American: 130 mL/min/{1.73_m2} (ref >=60)
GFR, Est Non African American: 112 mL/min/{1.73_m2} (ref >=60)
GLUCOSE: 94 mg/dL (ref 65–99)
Globulin: 2.8 g/dL (calc) (ref 1.9–3.7)
Potassium: 3.9 mmol/L (ref 3.5–5.3)
Sodium: 137 mmol/L (ref 135–146)
TOTAL PROTEIN: 7.2 g/dL (ref 6.1–8.1)
Total Bilirubin: 0.7 mg/dL (ref 0.2–1.2)

## 2018-01-03 LAB — HEMOGLOBIN A1C
EAG (MMOL/L): 4.7 (calc)
Hgb A1c MFr Bld: 4.6 % of total Hgb (ref <5.7)
MEAN PLASMA GLUCOSE: 85 (calc)

## 2018-01-03 LAB — VITAMIN D 1,25 DIHYDROXY
VITAMIN D 1, 25 (OH) TOTAL: 69 pg/mL (ref 18–72)
VITAMIN D3 1, 25 (OH): 69 pg/mL
Vitamin D2 1, 25 (OH)2: <8 pg/mL

## 2018-01-03 LAB — IRON,TIBC AND FERRITIN PANEL
%SAT: 60 % — AB (ref 11–50)
Ferritin: 96 ng/mL (ref 10–154)
IRON: 188 ug/dL (ref 40–190)
TIBC: 313 ug/dL (ref 250–450)

## 2018-01-03 LAB — VITAMIN B12: VITAMIN B 12: 342 pg/mL (ref 200–1100)

## 2018-01-03 LAB — LIPID PANEL W/REFLEX DIRECT LDL
CHOL/HDL RATIO: 3.7 (calc) (ref <5.0)
Cholesterol: 221 mg/dL — ABNORMAL HIGH (ref <200)
HDL: 59 mg/dL (ref >50)
LDL Cholesterol (Calc): 142 mg/dL (calc) — ABNORMAL HIGH
NON-HDL CHOLESTEROL (CALC): 162 mg/dL — AB (ref <130)
Triglycerides: 96 mg/dL (ref <150)

## 2018-01-03 LAB — TSH: TSH: 1.79 m[IU]/L

## 2018-01-05 NOTE — Progress Notes (Signed)
Call pt:  A!c is great! One liver enzyme is just a tad elevated.  Vitamin D looks good.  Vitamin b12 is low normal. I would suggest OTC b12 supplement.  Cholesterol LDL is a little elevated work on low fat diet and exercise. Overall CV risk still low.

## 2018-01-08 NOTE — Progress Notes (Signed)
Call pt: it is fine to continue eating eggs. Some cholesterol elevation can be purely genetic. You numbers are not associated with any significant risk at this point but something we should watch with age and if any new risk factors occur such as diabetes. Keep up the good diet. Recheck in 1-2 years. Red yeast rice could help lower LDL numbners. 1200mg  bid

## 2018-01-15 ENCOUNTER — Other Ambulatory Visit: Payer: Self-pay | Admitting: Physician Assistant

## 2018-01-15 DIAGNOSIS — J4550 Severe persistent asthma, uncomplicated: Secondary | ICD-10-CM

## 2018-01-29 ENCOUNTER — Telehealth: Payer: Self-pay

## 2018-02-02 NOTE — Telephone Encounter (Signed)
No additional encounter notes found. -[facility] 

## 2018-02-17 ENCOUNTER — Other Ambulatory Visit: Payer: Self-pay | Admitting: Physician Assistant

## 2018-02-17 DIAGNOSIS — J4550 Severe persistent asthma, uncomplicated: Secondary | ICD-10-CM

## 2018-03-22 ENCOUNTER — Other Ambulatory Visit: Payer: Self-pay | Admitting: Physician Assistant

## 2018-03-22 DIAGNOSIS — J4550 Severe persistent asthma, uncomplicated: Secondary | ICD-10-CM

## 2018-03-25 ENCOUNTER — Other Ambulatory Visit: Payer: Self-pay | Admitting: Physician Assistant

## 2018-03-25 DIAGNOSIS — J4550 Severe persistent asthma, uncomplicated: Secondary | ICD-10-CM

## 2018-03-26 ENCOUNTER — Encounter: Payer: Self-pay | Admitting: Physician Assistant

## 2018-04-15 DIAGNOSIS — Z23 Encounter for immunization: Secondary | ICD-10-CM | POA: Diagnosis not present

## 2018-05-24 ENCOUNTER — Ambulatory Visit: Payer: BLUE CROSS/BLUE SHIELD | Admitting: Physician Assistant

## 2018-05-24 NOTE — Telephone Encounter (Signed)
RB please advise on patient email.  Thanks!   Good evening.     I was in for an appointment in Dec 22, 2017 for severe asthma, following a flare up. It was requested that I make an appointment to have a blood test done in October or November to check an IgE and eosinophil count after I had been off of prednisone a few months. I am back on prednisone now for a flare that started October 23, and is still continuing. Because of this, when should I schedule my blood test? Should I wait until February or March?     I had a CBC/DIFF/PLT blood test done June 7th for an un-related issue, and my Eosinophils Absolute was recorded at at 209 cells/uL. Please let me know and I'll schedule the appointment!     Thank you for your time.    Best,     Carol [name]

## 2018-05-24 NOTE — Telephone Encounter (Signed)
Please let her know that i'd like to get copies of the June 7 blood work.  Also, I believe we can check her IgE and CBC+diff about 6 weeks after she gets off the prednisone.

## 2018-05-25 ENCOUNTER — Other Ambulatory Visit: Payer: Self-pay | Admitting: Physician Assistant

## 2018-05-25 DIAGNOSIS — J4550 Severe persistent asthma, uncomplicated: Secondary | ICD-10-CM

## 2018-05-25 NOTE — Telephone Encounter (Signed)
Looks like lab results are in Epic routed CBC w/ Diff results to Dr. Delton Coombes  Nothing further needed. Will route to [place] to keep follow up on

## 2018-05-28 ENCOUNTER — Encounter: Payer: Self-pay | Admitting: Physician Assistant

## 2018-05-28 ENCOUNTER — Ambulatory Visit: Payer: BLUE CROSS/BLUE SHIELD | Admitting: Physician Assistant

## 2018-05-28 VITALS — BP 137/75 | HR 90 | Ht 62.0 in | Wt 167.0 lb

## 2018-05-28 DIAGNOSIS — J4550 Severe persistent asthma, uncomplicated: Secondary | ICD-10-CM

## 2018-05-28 DIAGNOSIS — R234 Changes in skin texture: Secondary | ICD-10-CM | POA: Diagnosis not present

## 2018-05-28 MED ORDER — MONTELUKAST SODIUM 10 MG PO TABS: 10.0000 mg | ORAL_TABLET | Freq: Every day | ORAL | 4 refills | Status: DC

## 2018-05-28 MED ORDER — TRIAMCINOLONE ACETONIDE 0.5 % EX CREA: 1.0000 "application " | TOPICAL_CREAM | Freq: Two times a day (BID) | CUTANEOUS | 0 refills | Status: DC

## 2018-05-28 MED ORDER — PREDNISONE 10 MG PO TABS: 10.0000 mg | ORAL_TABLET | Freq: Two times a day (BID) | ORAL | 0 refills | Status: DC

## 2018-05-28 NOTE — Progress Notes (Signed)
   Subjective:    Patient ID: Carol [name], female    DOB: Mar 07, 1991, 27 y.o.   MRN: [id number]  HPI  Pt is a 27 yo female with severe asthma that had an exacerbation last week due to exposure to eucalyptus. She went to [facility] and was started on prednisone. She starts 20mg  tomorrow. She is concerned because she is still having to use albuterol every 6 hours at least 2-3 times a day. She feels like she might need a longer prednisone taper. No fever, chills, body aches.   Pt does have some itchy, peeling of fingers. This is off and on and only the tips. Prednisone has helped a lot. She does wash her hands a lot.   .. Active Ambulatory Problems    Diagnosis Date Noted  . Severe persistent asthma without complication 10/03/2016  . Irregular periods/menstrual cycles 10/03/2016  . Nausea 01/01/2018  . Low energy 01/01/2018   Resolved Ambulatory Problems    Diagnosis Date Noted  . No Resolved Ambulatory Problems   Past Medical History:  Diagnosis Date  . Asthma   . Heart murmur   . Seizures (HCC)       Review of Systems See hpi.     Objective:   Physical Exam  Constitutional: She is oriented to person, place, and time. She appears well-developed and well-nourished.  HENT:  Head: Normocephalic and atraumatic.  Right Ear: External ear normal.  Left Ear: External ear normal.  Nose: Nose normal.  Mouth/Throat: Oropharynx is clear and moist.  Eyes: Conjunctivae are normal.  Neck: Normal range of motion. Neck supple.  Cardiovascular: Normal rate and regular rhythm.  Pulmonary/Chest: Effort normal and breath sounds normal. No respiratory distress. She has no wheezes.  Lymphadenopathy:    She has no cervical adenopathy.  Neurological: She is alert and oriented to person, place, and time.  Skin:  Peeling of finger tips of left hand. Some erythema.   Psychiatric: She has a normal mood and affect. Her behavior is normal.          Assessment & Plan:  Marland KitchenMarland KitchenDiagnoses and all orders  for this visit:  Severe persistent asthma without complication -     montelukast (SINGULAIR) 10 MG tablet; Take 1 tablet (10 mg total) by mouth at bedtime. -     predniSONE (DELTASONE) 10 MG tablet; Take 1 tablet (10 mg total) by mouth 2 (two) times daily with a meal. For 10 days.  Peeling skin -     triamcinolone cream (KENALOG) 0.5 %; Apply 1 application topically 2 (two) times daily. To affected areas.   Refilled singulair. Discussed lungs sounded great today. Added to the prednisone taper. Continue albuterol nebulizer as needed.keep trying to decrease albuterol dose. Follow up in 1 week if SOB not improving.   I suspect peeling of skin is due to some eczema. Given topical steroid.   Pt declined pap smear again. She has never been sexually active.

## 2018-05-30 ENCOUNTER — Encounter: Payer: Self-pay | Admitting: Physician Assistant

## 2018-08-02 ENCOUNTER — Encounter: Payer: Self-pay | Admitting: Physician Assistant

## 2018-08-03 ENCOUNTER — Ambulatory Visit: Payer: BLUE CROSS/BLUE SHIELD | Admitting: Physician Assistant

## 2018-08-03 ENCOUNTER — Encounter: Payer: Self-pay | Admitting: Physician Assistant

## 2018-08-03 VITALS — BP 113/54 | HR 80 | Temp 98.8°F | Wt 173.0 lb

## 2018-08-03 DIAGNOSIS — J22 Unspecified acute lower respiratory infection: Secondary | ICD-10-CM

## 2018-08-03 DIAGNOSIS — R05 Cough: Secondary | ICD-10-CM | POA: Diagnosis not present

## 2018-08-03 DIAGNOSIS — R059 Cough, unspecified: Secondary | ICD-10-CM

## 2018-08-03 DIAGNOSIS — J4551 Severe persistent asthma with (acute) exacerbation: Secondary | ICD-10-CM

## 2018-08-03 MED ORDER — PREDNISONE 20 MG PO TABS: ORAL_TABLET | ORAL | 0 refills | Status: DC

## 2018-08-03 MED ORDER — ACETAMINOPHEN-CODEINE 120-12 MG/5ML PO SUSP: 5.0000 mL | Freq: Two times a day (BID) | ORAL | 0 refills | Status: AC

## 2018-08-03 MED ORDER — BENZONATATE 200 MG PO CAPS: 200.0000 mg | ORAL_CAPSULE | Freq: Two times a day (BID) | ORAL | 0 refills | Status: DC | PRN

## 2018-08-03 NOTE — Progress Notes (Signed)
Subjective:    Patient ID: Carol [name], female    DOB: July 13, 1991, 28 y.o.   MRN: [id number]  HPI  Patient is a 28 year old female with severe persistent asthma with many environmental triggers who presents to the clinic with 5 days of cough, shortness of breath, chest tightness, wheezing.  She does have a sick contact with her father over the holidays.  She started with some sinus congestion and postnasal drip that quickly went into her chest.  She is on a daily regimen of Singulair, Flovent, and Allegra.  Almost good days she does not use her DuoNeb or Xopenex.  For the past 3 days she has been using DuoNeb at least 3 times a day and Xopenex in between.  No fever, chills, body aches, nausea, vomiting, diarrhea.  She has been taking some Mucinex over-the-counter.  She will get into coughing fits where she feels like her chest is spasming and she cannot breathe as well.  She does use Flexeril and helps with the muscle spasms.  .. Active Ambulatory Problems    Diagnosis Date Noted  . Severe persistent asthma without complication 10/03/2016  . Irregular periods/menstrual cycles 10/03/2016  . Nausea 01/01/2018  . Low energy 01/01/2018   Resolved Ambulatory Problems    Diagnosis Date Noted  . No Resolved Ambulatory Problems   Past Medical History:  Diagnosis Date  . Asthma   . Heart murmur   . Seizures (HCC)      Review of Systems See HPI>     Objective:   Physical Exam Vitals signs reviewed.  Constitutional:      Appearance: Normal appearance.  HENT:     Head: Normocephalic and atraumatic.     Right Ear: Tympanic membrane and ear canal normal.     Left Ear: Tympanic membrane and ear canal normal.     Nose: Congestion present.     Mouth/Throat:     Mouth: Mucous membranes are moist.     Pharynx: No posterior oropharyngeal erythema.  Eyes:     Conjunctiva/sclera: Conjunctivae normal.  Cardiovascular:     Rate and Rhythm: Normal rate and regular rhythm.     Heart sounds:  Murmur present.  Pulmonary:     Comments: Not able to take full deep breath due to coughing.  No wheezing heard on exam; infact air movement was decreased.  No rhonchi.  Neurological:     General: No focal deficit present.     Mental Status: She is alert and oriented to person, place, and time.  Psychiatric:        Mood and Affect: Mood normal.        Behavior: Behavior normal.           Assessment & Plan:  Marland Kitchen.Marland Kitchen.Diagnoses and all orders for this visit:  Lower respiratory infection -     predniSONE (DELTASONE) 20 MG tablet; Take 3 tablets for 3 days, take 2 tablets for 3 days, take 1 tablet for 3 days, take 1/2 tablet for 4 days.  Severe persistent asthma with exacerbation -     predniSONE (DELTASONE) 20 MG tablet; Take 3 tablets for 3 days, take 2 tablets for 3 days, take 1 tablet for 3 days, take 1/2 tablet for 4 days.  Cough -     benzonatate (TESSALON) 200 MG capsule; Take 1 capsule (200 mg total) by mouth 2 (two) times daily as needed for cough. -     acetaminophen-codeine 120-12 MG/5ML suspension; Take 5 mLs by mouth every  12 (twelve) hours for 5 days.   Patient has a long history of asthma and rapidly worsening.  I did discuss with her today I see no signs of infection on exam.  I started a prednisone taper.  She can continue with her DuoNeb and Xopenex treatment as needed.  I did give her some Tessalon Perles during the day and Tylenol with codeine at bedtime to see if could control some of the more violent coughing spells.  Rest and hydrate.  Discussed signs of infection.  Reassured her I heard no suspicious lung findings consistent with pneumonia.  Vital signs are great and afebrile today.  Pulse ox 100%.

## 2018-08-03 NOTE — Patient Instructions (Signed)
Viral Respiratory Infection  A viral respiratory infection is an illness that affects parts of the body that are used for breathing. These include the lungs, nose, and throat. It is caused by a germ called a virus.  Some examples of this kind of infection are:  · A cold.  · The flu (influenza).  · A respiratory syncytial virus (RSV) infection.  A person who gets this illness may have the following symptoms:  · A stuffy or runny nose.  · Yellow or green fluid in the nose.  · A cough.  · Sneezing.  · Tiredness (fatigue).  · Achy muscles.  · A sore throat.  · Sweating or chills.  · A fever.  · A headache.  Follow these instructions at home:  Managing pain and congestion  · Take over-the-counter and prescription medicines only as told by your doctor.  · If you have a sore throat, gargle with salt water. Do this 3-4 times per day or as needed. To make a salt-water mixture, dissolve ½-1 tsp of salt in 1 cup of warm water. Make sure that all the salt dissolves.  · Use nose drops made from salt water. This helps with stuffiness (congestion). It also helps soften the skin around your nose.  · Drink enough fluid to keep your pee (urine) pale yellow.  General instructions    · Rest as much as possible.  · Do not drink alcohol.  · Do not use any products that have nicotine or tobacco, such as cigarettes and e-cigarettes. If you need help quitting, ask your doctor.  · Keep all follow-up visits as told by your doctor. This is important.  How is this prevented?    · Get a flu shot every year. Ask your doctor when you should get your flu shot.  · Do not let other people get your germs. If you are sick:  ? Stay home from work or school.  ? Wash your hands with soap and water often. Wash your hands after you cough or sneeze. If soap and water are not available, use hand sanitizer.  · Avoid contact with people who are sick during cold and flu season. This is in fall and winter.  Get help if:  · Your symptoms last for 10 days or  longer.  · Your symptoms get worse over time.  · You have a fever.  · You have very bad pain in your face or forehead.  · Parts of your jaw or neck become very swollen.  Get help right away if:  · You feel pain or pressure in your chest.  · You have shortness of breath.  · You faint or feel like you will faint.  · You keep throwing up (vomiting).  · You feel confused.  Summary  · A viral respiratory infection is an illness that affects parts of the body that are used for breathing.  · Examples of this illness include a cold, the flu, and respiratory syncytial virus (RSV) infection.  · The infection can cause a runny nose, cough, sneezing, sore throat, and fever.  · Follow what your doctor tells you about taking medicines, drinking lots of fluid, washing your hands, resting at home, and avoiding people who are sick.  This information is not intended to replace advice given to you by your health care provider. Make sure you discuss any questions you have with your health care provider.  Document Released: 06/26/2008 Document Revised: 08/24/2017 Document Reviewed: 08/24/2017  Elsevier   Interactive Patient Education © 2019 [employer]vier Inc.

## 2018-08-06 ENCOUNTER — Ambulatory Visit: Payer: BLUE CROSS/BLUE SHIELD | Admitting: Physician Assistant

## 2018-08-27 ENCOUNTER — Encounter: Payer: Self-pay | Admitting: Physician Assistant

## 2018-09-09 ENCOUNTER — Other Ambulatory Visit: Payer: Self-pay | Admitting: Physician Assistant

## 2018-09-09 DIAGNOSIS — N926 Irregular menstruation, unspecified: Secondary | ICD-10-CM

## 2018-09-27 ENCOUNTER — Other Ambulatory Visit: Payer: Self-pay | Admitting: Physician Assistant

## 2018-09-27 ENCOUNTER — Encounter: Payer: Self-pay | Admitting: Physician Assistant

## 2018-09-27 DIAGNOSIS — N926 Irregular menstruation, unspecified: Secondary | ICD-10-CM

## 2018-09-29 ENCOUNTER — Ambulatory Visit: Payer: BLUE CROSS/BLUE SHIELD | Admitting: Physician Assistant

## 2018-09-29 ENCOUNTER — Encounter: Payer: Self-pay | Admitting: Physician Assistant

## 2018-09-29 VITALS — BP 126/76 | HR 73 | Temp 98.0°F | Wt 172.0 lb

## 2018-09-29 DIAGNOSIS — J302 Other seasonal allergic rhinitis: Secondary | ICD-10-CM | POA: Diagnosis not present

## 2018-09-29 DIAGNOSIS — J4550 Severe persistent asthma, uncomplicated: Secondary | ICD-10-CM | POA: Diagnosis not present

## 2018-09-29 MED ORDER — PREDNISONE 20 MG PO TABS: ORAL_TABLET | ORAL | 0 refills | Status: DC

## 2018-09-29 MED ORDER — FLUTICASONE PROPIONATE 50 MCG/ACT NA SUSP: 2.0000 | Freq: Every day | NASAL | 6 refills | Status: DC

## 2018-09-29 NOTE — Progress Notes (Signed)
Subjective:    Patient ID: Carol [name], female    DOB: 1991/04/26, 28 y.o.   MRN: [id number]  HPI  Patient is a 28 year old female with severe persistent asthma with sudden severe exacerbations.  She is currently not symptomatic but would like to go over a plan for the spring and coronavirus.  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  She is currently well controlled on Flovent, Allegra, Singulair, Xopenex, DuoNeb.  .. Active Ambulatory Problems    Diagnosis Date Noted  . Severe persistent asthma without complication 10/03/2016  . Irregular periods/menstrual cycles 10/03/2016  . Nausea 01/01/2018  . Low energy 01/01/2018  . Seasonal allergies 10/04/2018   Resolved Ambulatory Problems    Diagnosis Date Noted  . No Resolved Ambulatory Problems   Past Medical History:  Diagnosis Date  . Asthma   . Heart murmur   . Seizures (HCC)      Review of Systems  All other systems reviewed and are negative.      Objective:   Physical Exam Vitals signs reviewed.  Constitutional:      Appearance: Normal appearance.  HENT:     Head: Normocephalic and atraumatic.     Right Ear: Tympanic membrane normal.     Left Ear: Tympanic membrane normal.     Nose: Nose normal.  Eyes:     Conjunctiva/sclera: Conjunctivae normal.  Cardiovascular:     Rate and Rhythm: Normal rate.     Pulses: Normal pulses.  Pulmonary:     Effort: Pulmonary effort is normal.     Breath sounds: Normal breath sounds.  Neurological:     General: No focal deficit present.     Mental Status: She is alert.  Psychiatric:        Mood and Affect: Mood normal.           Assessment & Plan:  Marland KitchenMarland KitchenBrailey was seen today for asthma.  Diagnoses and all orders for this visit:  Severe persistent asthma without complication -     predniSONE (DELTASONE) 20 MG tablet; Take 3 tablets for 3 days, take 2 tablets for 3 days, take 1 tablet for 3 days, take 1/2 tablet for 4 days. -     fluticasone  (FLONASE) 50 MCG/ACT nasal spray; Place 2 sprays into both nostrils daily.  Seasonal allergies -     fluticasone (FLONASE) 50 MCG/ACT nasal spray; Place 2 sprays into both nostrils daily.   Patient is actually doing really well.  We come together to discuss the plan for the spring/coronavirus.  Patient is extremely high risk for rapid decompensation if she were to contract the coronavirus.  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  Her goal would be to decrease exposure.  I would like for her to do more video based lectures and have patients turn in assignments via email/digitally.  She should keep pain sanitizer in the classroom.  I did give her a dose of prednisone taper that she could start if having shortness of breath, wheezing.  Allergies are more of her trigger.  She should consider canceling classes on high pollen days.  Added Flonase to her allergy regimen.  Discussed the biggest thing that she could do is wash her hands.  absolutely no travel for her at this time.   Note written for work to give her discretion on how to conduct her class to keep her safe.   Marland Kitchen.Spent 30 minutes with patient and greater  than 50 percent of visit spent counseling patient regarding treatment plan.

## 2018-10-01 DIAGNOSIS — H52223 Regular astigmatism, bilateral: Secondary | ICD-10-CM | POA: Diagnosis not present

## 2018-10-04 ENCOUNTER — Encounter: Payer: Self-pay | Admitting: Physician Assistant

## 2018-10-04 DIAGNOSIS — J3089 Other allergic rhinitis: Secondary | ICD-10-CM | POA: Insufficient documentation

## 2018-10-04 DIAGNOSIS — J302 Other seasonal allergic rhinitis: Secondary | ICD-10-CM | POA: Insufficient documentation

## 2018-10-14 ENCOUNTER — Other Ambulatory Visit: Payer: Self-pay | Admitting: Physician Assistant

## 2018-10-14 DIAGNOSIS — N926 Irregular menstruation, unspecified: Secondary | ICD-10-CM

## 2018-10-15 ENCOUNTER — Other Ambulatory Visit: Payer: Self-pay | Admitting: Physician Assistant

## 2018-10-15 DIAGNOSIS — N926 Irregular menstruation, unspecified: Secondary | ICD-10-CM

## 2018-10-26 ENCOUNTER — Encounter: Payer: Self-pay | Admitting: Physician Assistant

## 2018-10-26 DIAGNOSIS — N926 Irregular menstruation, unspecified: Secondary | ICD-10-CM

## 2018-10-26 DIAGNOSIS — J4550 Severe persistent asthma, uncomplicated: Secondary | ICD-10-CM

## 2018-10-27 MED ORDER — FLUTICASONE PROPIONATE HFA 220 MCG/ACT IN AERO: 2.0000 | INHALATION_SPRAY | Freq: Two times a day (BID) | RESPIRATORY_TRACT | 1 refills | Status: DC

## 2018-10-27 MED ORDER — NORETHINDRONE ACET-ETHINYL EST 1.5-30 MG-MCG PO TABS: 1.0000 | ORAL_TABLET | Freq: Every day | ORAL | 1 refills | Status: DC

## 2018-10-27 MED ORDER — MONTELUKAST SODIUM 10 MG PO TABS: 10.0000 mg | ORAL_TABLET | Freq: Every day | ORAL | 1 refills | Status: DC

## 2018-11-22 ENCOUNTER — Encounter: Payer: Self-pay | Admitting: Physician Assistant

## 2018-12-13 ENCOUNTER — Encounter: Payer: Self-pay | Admitting: Physician Assistant

## 2018-12-28 ENCOUNTER — Other Ambulatory Visit: Payer: Self-pay | Admitting: Physician Assistant

## 2018-12-28 DIAGNOSIS — N926 Irregular menstruation, unspecified: Secondary | ICD-10-CM

## 2019-01-10 ENCOUNTER — Encounter: Payer: Self-pay | Admitting: Neurology

## 2019-01-10 ENCOUNTER — Ambulatory Visit: Payer: BLUE CROSS/BLUE SHIELD | Admitting: Physician Assistant

## 2019-01-11 ENCOUNTER — Encounter: Payer: Self-pay | Admitting: Physician Assistant

## 2019-01-11 ENCOUNTER — Ambulatory Visit (INDEPENDENT_AMBULATORY_CARE_PROVIDER_SITE_OTHER): Payer: BLUE CROSS/BLUE SHIELD | Admitting: Physician Assistant

## 2019-01-11 DIAGNOSIS — N926 Irregular menstruation, unspecified: Secondary | ICD-10-CM

## 2019-01-11 DIAGNOSIS — J4550 Severe persistent asthma, uncomplicated: Secondary | ICD-10-CM | POA: Diagnosis not present

## 2019-01-11 MED ORDER — NORETHINDRONE ACET-ETHINYL EST 1.5-30 MG-MCG PO TABS: 1.0000 | ORAL_TABLET | Freq: Every day | ORAL | 2 refills | Status: DC

## 2019-01-11 MED ORDER — MONTELUKAST SODIUM 10 MG PO TABS: 10.0000 mg | ORAL_TABLET | Freq: Every day | ORAL | 4 refills | Status: DC

## 2019-01-11 MED ORDER — FLOVENT HFA 220 MCG/ACT IN AERO: 2.0000 | INHALATION_SPRAY | Freq: Two times a day (BID) | RESPIRATORY_TRACT | 1 refills | Status: DC

## 2019-01-11 NOTE — Progress Notes (Deleted)
Patient had an unusual asthma experience she wants to discuss. Also wants to discuss precautions for returning to work in the fall.

## 2019-01-11 NOTE — Progress Notes (Signed)
Patient ID: Carol [name], female   DOB: January 22, 1991, 28 y.o.   MRN: [id number] .Marland KitchenVirtual Visit via Video Note  I connected with [name] on 01/11/19 at  1:40 PM EDT by a video enabled telemedicine application and verified that I am speaking with the correct person using two identifiers.  Location: Patient: home Provider: home   I discussed the limitations of evaluation and management by telemedicine and the availability of in person appointments. The patient expressed understanding and agreed to proceed.  History of Present Illness: Pt is a 28 yo female with severe persistent asthma and seasonal and environmental allergies who needs refills and to discuss plan for going back to work at [employer] this fall. She is concerned about teaching in class. She wants to know my thoughts. She has done pretty well. She has had one asthma attack that took a week to recover from. No cough, sOB, fever today. She is feeling good. She is staying in [place] with her parents through the summer. She continues on flovent and singulair.   .. Active Ambulatory Problems    Diagnosis Date Noted  . Severe persistent asthma without complication 62/69/4854  . Irregular periods/menstrual cycles 10/03/2016  . Nausea 01/01/2018  . Low energy 01/01/2018  . Seasonal allergies 10/04/2018   Resolved Ambulatory Problems    Diagnosis Date Noted  . No Resolved Ambulatory Problems   Past Medical History:  Diagnosis Date  . Asthma   . Heart murmur   . Seizures ([place])    Reviewed med, allergy, problem list.    Observations/Objective: NO acute distress.  Normal breathing.  Normal mood.   .. Today's Vitals   01/11/19 1314  BP: 110/80  Pulse: 79  Temp: 98.9 F (37.2 C)  TempSrc: Oral  Height: 5\' 2"  (1.575 m)   Body mass index is 31.46 kg/m.     Assessment and Plan: Marland KitchenMarland KitchenTrude was seen today for asthma.  Diagnoses and all orders for this visit:  Severe persistent asthma without  complication -     fluticasone (FLOVENT HFA) 220 MCG/ACT inhaler; Inhale 2 puffs into the lungs 2 (two) times daily. -     montelukast (SINGULAIR) 10 MG tablet; Take 1 tablet (10 mg total) by mouth at bedtime.  Irregular periods/menstrual cycles -     Norethindrone Acetate-Ethinyl Estradiol (JUNEL 1.5/30) 1.5-30 MG-MCG tablet; Take 1 tablet by mouth daily.  Pt is medium to high risk for COVID complications. 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 Will make formal letter as needed.   Pt is not sexually active nor has she ever been she does not need pap. OCP refilled.   Follow Up Instructions:    I discussed the assessment and treatment plan with the patient. The patient was provided an opportunity to ask questions and all were answered. The patient agreed with the plan and demonstrated an understanding of the instructions.   The patient was advised to call back or seek an in-person evaluation if the symptoms worsen or if the condition fails to improve as anticipated.    Iran Planas, PA-C

## 2019-04-16 ENCOUNTER — Other Ambulatory Visit: Payer: Self-pay | Admitting: Physician Assistant

## 2019-04-16 DIAGNOSIS — J4550 Severe persistent asthma, uncomplicated: Secondary | ICD-10-CM

## 2019-04-16 DIAGNOSIS — N926 Irregular menstruation, unspecified: Secondary | ICD-10-CM

## 2019-06-16 ENCOUNTER — Telehealth: Payer: BLUE CROSS/BLUE SHIELD | Admitting: Emergency Medicine

## 2019-06-16 ENCOUNTER — Encounter: Payer: Self-pay | Admitting: Physician Assistant

## 2019-06-16 DIAGNOSIS — J4551 Severe persistent asthma with (acute) exacerbation: Secondary | ICD-10-CM | POA: Diagnosis not present

## 2019-06-16 DIAGNOSIS — J4550 Severe persistent asthma, uncomplicated: Secondary | ICD-10-CM

## 2019-06-16 MED ORDER — METHYLPREDNISOLONE 4 MG PO TBPK: ORAL_TABLET | ORAL | 0 refills | Status: DC

## 2019-06-16 NOTE — Addendum Note (Signed)
Addended by: Margarita Mail on: 06/16/2019 05:51 PM   Modules accepted: Orders

## 2019-06-16 NOTE — Progress Notes (Signed)
Visit for Asthma  Based on what you have shared with me, it looks like you may have a flare up of your asthma.  Asthma is a chronic (ongoing) lung disease which results in airway obstruction, inflammation and hyper-responsiveness.   Asthma symptoms vary from person to person, with common symptoms including nighttime awakening and decreased ability to participate in normal activities as a result of shortness of breath. It is often triggered by changes in weather, changes in the season, changes in air temperature, or inside (home, school, daycare or work) allergens such as animal dander, mold, mildew, woodstoves or cockroaches.   It can also be triggered by hormonal changes, extreme emotion, physical exertion or an upper respiratory tract illness.     It is important to identify the trigger, and then eliminate or avoid the trigger if possible.   If you have been prescribed medications to be taken on a regular basis, it is important to follow the asthma action plan and to follow guidelines to adjust medication in response to increasing symptoms of decreased peak expiratory flow rate  Treatment: I have prescribed: medrol dosepack taper, use as directed  HOME CARE . Only take medications as instructed by your medical team. . Consider wearing a mask or scarf to improve breathing air temperature have been shown to decrease irritation and decrease exacerbations . Get rest. . Taking a steamy shower or using a humidifier may help nasal congestion sand ease sore throat pain. You can place a towel over your head and breathe in the steam from hot water coming from a faucet. . Using a saline nasal spray works much the same way.  . Cough drops, hare candies and sore throat lozenges may ease your cough.  . Avoid close contacts especially the very you and the elderly . Cover your mouth if you cough or  sneeze . Always remember to wash your hands.    GET HELP RIGHT AWAY IF: . You develop worsening symptoms; breathlessness at rest, drowsy, confused or agitated, unable to speak in full sentences . You have coughing fits . You develop a severe headache or visual changes . You develop shortness of breath, difficulty breathing or start having chest pain . Your symptoms persist after you have completed your treatment plan . If your symptoms do not improve within 10 days  MAKE SURE YOU . Understand these instructions. . Will watch your condition. . Will get help right away if you are not doing well or get worse.   Your e-visit answers were reviewed by a board certified advanced clinical practitioner to complete your personal care plan, Depending upon the condition, your plan could have included both over the counter or prescription medications.  Please review your pharmacy choice. Your safety is important to [place]. If you have drug allergies check your prescription carefully. You can use MyChart to ask questions about today's visit, request a non-urgent call back, or ask for a work or school excuse for 24 hours related to this e-Visit. If it has been greater than 24 hours you will need to follow up with your provider, or enter a new e-Visit to address those concerns.  You will get an e-mail in the next two days asking about your experience. I hope that your e-visit has been valuable and will speed your recovery. Thank you for using e-visits.  Greater than 5 but less than 10 minutes spent researching, coordinating, and implementing care for this patient today

## 2019-06-20 ENCOUNTER — Telehealth (INDEPENDENT_AMBULATORY_CARE_PROVIDER_SITE_OTHER): Payer: BLUE CROSS/BLUE SHIELD | Admitting: Physician Assistant

## 2019-06-20 ENCOUNTER — Encounter: Payer: Self-pay | Admitting: Physician Assistant

## 2019-06-20 DIAGNOSIS — J4550 Severe persistent asthma, uncomplicated: Secondary | ICD-10-CM

## 2019-06-20 MED ORDER — PREDNISONE 20 MG PO TABS: ORAL_TABLET | ORAL | 0 refills | Status: DC

## 2019-06-20 MED ORDER — CYCLOBENZAPRINE HCL 10 MG PO TABS: ORAL_TABLET | ORAL | 5 refills | Status: DC

## 2019-06-20 NOTE — Progress Notes (Signed)
Patient ID: Carol [name], female   DOB: 1990/08/30, 28 y.o.   MRN: [id number]  .Marland KitchenVirtual Visit via Video Note  I connected with [name] on 06/20/19 at  8:10 AM EST by a video enabled telemedicine application and verified that I am speaking with the correct person using two identifiers.  Location: Patient: home Provider: clinic   I discussed the limitations of evaluation and management by telemedicine and the availability of in person appointments. The patient expressed understanding and agreed to proceed.  History of Present Illness: Pt is a 28 yo female with severe persistent asthma and seasonal allergies who calls into the clinic to follow up from 11/19 televisit for asthma exacerbation. She was started on methylprednisolone and she is doing relatively well on this dose.  She does still feel some chest tightness and concerned because at her last dose of methylprednisolone.  This is not her typical asthma exacerbation therapy of prednisone.  She wonders if there is anything else she should do.  She is currently not needing her nebulizers or albuterol inhaler.  She continues Singulair, Flonase, Allegra, Flovent.  She is not aware of any trigger that brought this exacerbation on.  She does get muscle spasms in her chest after her asthma attacks.  She requests a refill of Flexeril.  Patient has been very careful with Covid precautions.  She is really not left her apartment.  She denies any other upper respiratory symptoms, fever, chills.  She has no direct Covid contact and/or sick contacts.  She does live alone.  Patient has had 3 asthma exacerbations this year.  .. Active Ambulatory Problems    Diagnosis Date Noted  . Severe persistent asthma without complication 10/03/2016  . Irregular periods/menstrual cycles 10/03/2016  . Nausea 01/01/2018  . Low energy 01/01/2018  . Seasonal allergies 10/04/2018   Resolved Ambulatory Problems    Diagnosis Date Noted  . No Resolved Ambulatory  Problems   Past Medical History:  Diagnosis Date  . Asthma   . Heart murmur   . Seizures ([facility])    REviewed med, allergy, problem list.    Observations/Objective: No acute distress. Normal breathing. No cough.  Normal mood and appearance.   .. Today's Vitals   06/20/19 0757  Temp: 98.5 F (36.9 C)  TempSrc: Oral  Weight: 171 lb 15.3 oz (78 kg)  Height: 5\' 2"  (1.575 m)   Body mass index is 31.45 kg/m.      Assessment and Plan: Marland KitchenQuinesha was seen today for asthma.  Diagnoses and all orders for this visit:  Severe persistent asthma without complication -     predniSONE (DELTASONE) 20 MG tablet; Take 3 tablets for 3 days, take 2 tablets for 3 days, take 1 tablet for 3 days, take 1/2 tablet for 4 days. -     cyclobenzaprine (FLEXERIL) 10 MG tablet; TAKE 1 TABLET BY MOUTH THREE TIMES DAILY AS NEEDED MUSCLE  SPASMS   Patient does appear in some like she is doing much better.  I did go ahead and send over her usual prednisone taper.  I would like for her to hold onto this if she feels the asthma symptoms coming back and/or for her next flare.  Please follow-up after she uses prednisone.  Her exacerbations are fast therefore I had like to keep this on hand to get ahead of them.  I did have a discussion about switching Flovent to Symbicort.  She will look into this.  If she is okay with this switch we  will make this and then follow-up in 4 to 6 weeks after starting Symbicort.  Our hope would be that this would decrease a few exacerbations.  Flexeril for her muscle spasms after her asthma exacerbation was refilled today.   Follow Up Instructions:    I discussed the assessment and treatment plan with the patient. The patient was provided an opportunity to ask questions and all were answered. The patient agreed with the plan and demonstrated an understanding of the instructions.   The patient was advised to call back or seek an in-person evaluation if the symptoms worsen or if the  condition fails to improve as anticipated.   Iran Planas, PA-C

## 2019-06-20 NOTE — Progress Notes (Signed)
Asthma flare started Wednesday Thursday did e-visit and was put on methylprednisone - helping

## 2019-06-27 ENCOUNTER — Encounter: Payer: Self-pay | Admitting: Physician Assistant

## 2019-06-27 DIAGNOSIS — J4551 Severe persistent asthma with (acute) exacerbation: Secondary | ICD-10-CM

## 2019-06-27 DIAGNOSIS — J4550 Severe persistent asthma, uncomplicated: Secondary | ICD-10-CM

## 2019-06-27 MED ORDER — BUDESONIDE-FORMOTEROL FUMARATE 160-4.5 MCG/ACT IN AERO: 2.0000 | INHALATION_SPRAY | Freq: Two times a day (BID) | RESPIRATORY_TRACT | 3 refills | Status: DC

## 2019-06-27 MED ORDER — IPRATROPIUM-ALBUTEROL 0.5-2.5 (3) MG/3ML IN SOLN: 3.0000 mL | RESPIRATORY_TRACT | 1 refills | Status: DC | PRN

## 2019-06-27 MED ORDER — ALBUTEROL SULFATE 108 (90 BASE) MCG/ACT IN AEPB: 2.0000 | INHALATION_SPRAY | RESPIRATORY_TRACT | 5 refills | Status: DC

## 2019-06-27 NOTE — Telephone Encounter (Signed)
Per note: "I did have a discussion about switching Flovent to Symbicort.  She will look into this.  If she is okay with this switch we will make this and then follow-up in 4 to 6 weeks after starting Symbicort"   Pended medication refills patient requested.  Discontinued Flovent from profile. Pended Symbicort but unsure what dose/directions. Please review that and other refills prior to sending

## 2019-06-28 MED ORDER — IPRATROPIUM-ALBUTEROL 0.5-2.5 (3) MG/3ML IN SOLN: 3.0000 mL | RESPIRATORY_TRACT | 1 refills | Status: DC | PRN

## 2019-06-28 NOTE — Addendum Note (Signed)
Addended byAnnamaria Helling on: 06/28/2019 09:32 AM   Modules accepted: Orders

## 2019-06-28 NOTE — Telephone Encounter (Signed)
Pharmacy states Duoneb can't be prescribed every 2 hours. Changed to every 4 hours, okay per [facility]. Resent to pharmacy.

## 2019-06-29 ENCOUNTER — Other Ambulatory Visit: Payer: Self-pay | Admitting: Physician Assistant

## 2019-06-29 DIAGNOSIS — J4550 Severe persistent asthma, uncomplicated: Secondary | ICD-10-CM

## 2019-07-31 ENCOUNTER — Other Ambulatory Visit: Payer: Self-pay | Admitting: Physician Assistant

## 2019-07-31 DIAGNOSIS — N926 Irregular menstruation, unspecified: Secondary | ICD-10-CM

## 2019-08-03 ENCOUNTER — Encounter: Payer: Self-pay | Admitting: Physician Assistant

## 2019-08-16 ENCOUNTER — Encounter: Payer: Self-pay | Admitting: Physician Assistant

## 2019-08-16 DIAGNOSIS — N926 Irregular menstruation, unspecified: Secondary | ICD-10-CM

## 2019-08-18 MED ORDER — NORETHINDRONE ACET-ETHINYL EST 1.5-30 MG-MCG PO TABS: 1.0000 | ORAL_TABLET | Freq: Every day | ORAL | 3 refills | Status: DC

## 2019-08-18 MED ORDER — BUDESONIDE-FORMOTEROL FUMARATE 160-4.5 MCG/ACT IN AERO: 2.0000 | INHALATION_SPRAY | Freq: Two times a day (BID) | RESPIRATORY_TRACT | 2 refills | Status: DC

## 2019-08-19 ENCOUNTER — Encounter: Payer: Self-pay | Admitting: Physician Assistant

## 2019-08-19 DIAGNOSIS — J4550 Severe persistent asthma, uncomplicated: Secondary | ICD-10-CM

## 2019-08-22 MED ORDER — PREDNISONE 20 MG PO TABS: ORAL_TABLET | ORAL | 0 refills | Status: DC

## 2019-08-31 ENCOUNTER — Encounter: Payer: Self-pay | Admitting: Emergency Medicine

## 2019-08-31 ENCOUNTER — Telehealth (INDEPENDENT_AMBULATORY_CARE_PROVIDER_SITE_OTHER): Payer: BLUE CROSS/BLUE SHIELD | Admitting: Emergency Medicine

## 2019-08-31 DIAGNOSIS — J4550 Severe persistent asthma, uncomplicated: Secondary | ICD-10-CM | POA: Diagnosis not present

## 2019-08-31 NOTE — Progress Notes (Signed)
Virtual Visit via Video Note  I connected with Carol [name] on 08/31/19 at  4:30 PM EST by a video enabled telemedicine application and verified that I am speaking with the correct person using two identifiers.  Location: Patient: Home Provider: Office   I discussed the limitations of evaluation and management by telemedicine and the availability of in person appointments. The patient expressed understanding and agreed to proceed.  History of Present Illness: 29 year old woman with a history of allergic disease, childhood and then adult asthma.  I last saw her in May 2019.  Her baseline peak flows at the time of 470.  She has sensitivities to dust mites, birch and feathers, cats, dogs, mold, pollen, eucalyptus.  She is also sensitive to smoke, clean air, cleaning solutions and perfumes.    Observations/Objective: She reports today that she has been having increased flaring over the last year, about every 3 months. Decreased peak flows, chest tightness, dyspnea. She is using frequent nebs during the flares, not always responsive. No new exposures. She has been mainly working from home. Makes it difficult for her to talk, ambulate.  She is able to distinguish these flares which seem to last for several weeks, sometimes require prolonged prednisone, repeat prednisone tapers, from her standard asthma which is characterized by trigger related bronchospasm that is responsive to bronchodilator.  During the longer flares she requires frequent DuoNeb, not clear that she gets full benefit.  Because she has had multiple flares and a recent [name] flare, prolonged, she just changed from Flovent bid to Symbicort in December. It does seem to have helped her at least with regard to her more standard episodes of bronchospasm. She remains on on flonase, singulair, allegra.    Assessment and Plan: Asthma with poor control for the last year, requiring pred about every 2-3 months. Based on her description it  sounds like there are 2 phenomenon here.  I question whether she may have evolved upper airway obstruction that is persistent, recurrent, harder to manage than her typical asthma.  Of note she has some voice changes, inability to breathe on inspiration, stuttering inspiration that would go along with VCD.  I explained this to her today she is going to try to pay attention to differentiate between upper and lower airways disease.  For now I think it is reasonable to stay on the Symbicort, use her short acting bronchodilators as needed.  She will continue her Flonase, Singulair, Allegra.  I think she needs to be considered for possible biologic therapy given the frequency of flares unless we become convinced that this is more of an upper airway phenomenon.  She needs an eosinophil count and IgE.  I would like to do this after she has had an extended period off prednisone.  Further she would like to defer at least for now until she can get the COVID-19 vaccine before she comes out to get labs.  We will plan to follow-up in the next few months, assess her breathing since exam may help [place] localize her airflow obstruction.  We will do labs at that time.  Follow Up Instructions: May 2021 or prn   I discussed the assessment and treatment plan with the patient. The patient was provided an opportunity to ask questions and all were answered. The patient agreed with the plan and demonstrated an understanding of the instructions.    The patient was advised to call back or seek an in-person evaluation if the symptoms worsen or if the condition fails  to improve as anticipated.  I provided 30 minutes of non-face-to-face time during this encounter.   Leslye Peer, MD

## 2019-09-28 ENCOUNTER — Encounter: Payer: Self-pay | Admitting: Physician Assistant

## 2019-10-03 NOTE — Telephone Encounter (Signed)
I think any of the 3 will be beneficial, but if possible I would try to get either the [facility] or Pfizer as they may offer more protection.

## 2019-11-29 ENCOUNTER — Encounter: Payer: Self-pay | Admitting: Physician Assistant

## 2019-11-29 DIAGNOSIS — J4550 Severe persistent asthma, uncomplicated: Secondary | ICD-10-CM

## 2019-11-29 MED ORDER — MONTELUKAST SODIUM 10 MG PO TABS: 10.0000 mg | ORAL_TABLET | Freq: Every day | ORAL | 1 refills | Status: DC

## 2019-11-30 ENCOUNTER — Other Ambulatory Visit: Payer: Self-pay | Admitting: Nurse Practitioner

## 2019-12-30 ENCOUNTER — Encounter: Payer: Self-pay | Admitting: Physician Assistant

## 2019-12-30 ENCOUNTER — Other Ambulatory Visit: Payer: Self-pay

## 2019-12-30 MED ORDER — BUDESONIDE-FORMOTEROL FUMARATE 160-4.5 MCG/ACT IN AERO: 2.0000 | INHALATION_SPRAY | Freq: Two times a day (BID) | RESPIRATORY_TRACT | 2 refills | Status: DC

## 2020-01-03 ENCOUNTER — Encounter: Payer: Self-pay | Admitting: Physician Assistant

## 2020-01-03 ENCOUNTER — Telehealth (INDEPENDENT_AMBULATORY_CARE_PROVIDER_SITE_OTHER): Payer: BLUE CROSS/BLUE SHIELD | Admitting: Physician Assistant

## 2020-01-03 DIAGNOSIS — K219 Gastro-esophageal reflux disease without esophagitis: Secondary | ICD-10-CM | POA: Insufficient documentation

## 2020-01-03 DIAGNOSIS — Z131 Encounter for screening for diabetes mellitus: Secondary | ICD-10-CM

## 2020-01-03 DIAGNOSIS — J302 Other seasonal allergic rhinitis: Secondary | ICD-10-CM

## 2020-01-03 DIAGNOSIS — Z13220 Encounter for screening for lipoid disorders: Secondary | ICD-10-CM

## 2020-01-03 DIAGNOSIS — J4550 Severe persistent asthma, uncomplicated: Secondary | ICD-10-CM | POA: Diagnosis not present

## 2020-01-03 DIAGNOSIS — J4551 Severe persistent asthma with (acute) exacerbation: Secondary | ICD-10-CM

## 2020-01-03 MED ORDER — FLUTICASONE PROPIONATE 50 MCG/ACT NA SUSP: 2.0000 | Freq: Every day | NASAL | 6 refills | Status: AC

## 2020-01-03 MED ORDER — IPRATROPIUM-ALBUTEROL 0.5-2.5 (3) MG/3ML IN SOLN: 3.0000 mL | RESPIRATORY_TRACT | 1 refills | Status: DC | PRN

## 2020-01-03 NOTE — Progress Notes (Signed)
Patient ID: Carol [name], female   DOB: 09/17/1990, 29 y.o.   MRN: [id number] .Marland KitchenVirtual Visit via Video Note  I connected with Carol [name] on 01/03/20 at  9:50 AM EDT by a video enabled telemedicine application and verified that I am speaking with the correct person using two identifiers.  Location: Patient: home Provider: clinic   I discussed the limitations of evaluation and management by telemedicine and the availability of in person appointments. The patient expressed understanding and agreed to proceed.  History of Present Illness: Patient is a 29 year old female with [name] asthma who presents to the clinic for follow-up.  She is just finished her two Covid vaccines.  She saw Dr. West Carbo put a pulmonologist back in February.  He is having her keep a exacerbations diary.  He continued her on Symbicort, Flonase, Singulair, Allegra.  He suggested there could be a GERD component to her asthma.  She went through a break-up and it was really hard on her. She noticed lots of things worsening during her heart break.  She started noticing more reflux symptoms.  She did start over-the-counter omeprazole.  It does help significantly with her reflux symptoms and she has not had to use prednisone since January.  She does wonder if this could be helping.  She would like to try to come off of it.  She agrees if her asthma or GERD worsens that she will stay on it.     .. Active Ambulatory Problems    Diagnosis Date Noted  . [name] persistent asthma without complication 89/21/1943  . Irregular periods/menstrual cycles 10/03/2016  . Nausea 01/01/2018  . Low energy 01/01/2018  . Seasonal allergies 10/04/2018  . Gastroesophageal reflux disease 01/03/2020   Resolved Ambulatory Problems    Diagnosis Date Noted  . No Resolved Ambulatory Problems   Past Medical History:  Diagnosis Date  . Asthma   . Heart murmur   . Seizures ([place])     Reviewed med, allergy, problem list.     Observations/Objective: No acute distress Normal mood and appearance.   .. Today's Vitals   There is no height or weight on file to calculate BMI.      Assessment and Plan: Marland KitchenMarland KitchenAvaiyah was seen today for follow-up.  Diagnoses and all orders for this visit:  [name] persistent asthma without complication -     fluticasone (FLONASE) 50 MCG/ACT nasal spray; Place 2 sprays into both nostrils daily. -     CBC  [name] persistent asthma with acute exacerbation -     ipratropium-albuterol (DUONEB) 0.5-2.5 (3) MG/3ML SOLN; Take 3 mLs by nebulization every 4 (four) hours as needed.  Seasonal allergies -     fluticasone (FLONASE) 50 MCG/ACT nasal spray; Place 2 sprays into both nostrils daily. -     CBC  Screening for lipid disorders -     Lipid Panel w/reflex Direct LDL  Screening for diabetes mellitus -     COMPLETE METABOLIC PANEL WITH GFR  Gastroesophageal reflux disease, unspecified whether esophagitis present   Continue follow up with Dr. Lamonte Sakai pulmonology.  Exacerbations are less. Continue on omeprazole. She wants to try a trial off of this. Ok to do so but watch for reflux symptoms and asthma exacerbations. Call back for refills if needed. Discussed GERD diet.   Continue symbicort, flonase, singulair, zyrtec, duoneb as needed.   Needs labs. Discussed to be fasting.    Follow Up Instructions:    I discussed the assessment and treatment plan with the patient. The  patient was provided an opportunity to ask questions and all were answered. The patient agreed with the plan and demonstrated an understanding of the instructions.   The patient was advised to call back or seek an in-person evaluation if the symptoms worsen or if the condition fails to improve as anticipated.  I provided 25 minutes of non-face-to-face time during this encounter.   Tandy Gaw, PA-C

## 2020-01-16 DIAGNOSIS — Z713 Dietary counseling and surveillance: Secondary | ICD-10-CM | POA: Diagnosis not present

## 2020-02-06 DIAGNOSIS — Z713 Dietary counseling and surveillance: Secondary | ICD-10-CM | POA: Diagnosis not present

## 2020-02-24 ENCOUNTER — Ambulatory Visit: Payer: BLUE CROSS/BLUE SHIELD | Admitting: Emergency Medicine

## 2020-03-05 ENCOUNTER — Ambulatory Visit: Payer: BLUE CROSS/BLUE SHIELD | Admitting: Emergency Medicine

## 2020-03-05 ENCOUNTER — Other Ambulatory Visit: Payer: Self-pay

## 2020-03-05 ENCOUNTER — Encounter: Payer: Self-pay | Admitting: Emergency Medicine

## 2020-03-05 DIAGNOSIS — J4550 Severe persistent asthma, uncomplicated: Secondary | ICD-10-CM

## 2020-03-05 DIAGNOSIS — J302 Other seasonal allergic rhinitis: Secondary | ICD-10-CM

## 2020-03-05 DIAGNOSIS — K219 Gastro-esophageal reflux disease without esophagitis: Secondary | ICD-10-CM | POA: Diagnosis not present

## 2020-03-05 LAB — CBC WITH DIFFERENTIAL/PLATELET
Basophils Absolute: 0 10*3/uL (ref 0.0–0.1)
Basophils Relative: 0.5 % (ref 0.0–3.0)
Eosinophils Absolute: 0.1 10*3/uL (ref 0.0–0.7)
Eosinophils Relative: 1.4 % (ref 0.0–5.0)
HCT: 39.4 % (ref 36.0–46.0)
Hemoglobin: 13.4 g/dL (ref 12.0–15.0)
Lymphocytes Relative: 29.2 % (ref 12.0–46.0)
Lymphs Abs: 2.4 10*3/uL (ref 0.7–4.0)
MCHC: 34.1 g/dL (ref 30.0–36.0)
MCV: 87.6 fl (ref 78.0–100.0)
Monocytes Absolute: 0.4 10*3/uL (ref 0.1–1.0)
Monocytes Relative: 5.6 % (ref 3.0–12.0)
Neutro Abs: 5.1 10*3/uL (ref 1.4–7.7)
Neutrophils Relative %: 63.3 % (ref 43.0–77.0)
Platelets: 394 10*3/uL (ref 150.0–400.0)
RBC: 4.5 Mil/uL (ref 3.87–5.11)
RDW: 13 % (ref 11.5–15.5)
WBC: 8.1 10*3/uL (ref 4.0–10.5)

## 2020-03-05 NOTE — Patient Instructions (Addendum)
We will arrange for methacholine pulmonary function testing. We will not schedule until we confirm the self-pay cost for you.   We will check blood work today to evaluate eosinophil count and IgE OK to try coming off Prilosec as planned, but pay attention to any breathing changes that may result. You may need to be on an acid suppression medication going forward.  Agree with keeping head elevated for sleep.  Continue flonase allegra, singulair. Follow with Dr. Delton Coombes next available with full pulmonary function testing on the same day.  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

## 2020-03-05 NOTE — Assessment & Plan Note (Signed)
Currently on a PPI but trying to come off with the assistance of her other physicians.  Continue elevating head of bed, diet control.  Ultimately she may need to go back on the PPI if her asthma suffers once it is stopped.

## 2020-03-05 NOTE — Assessment & Plan Note (Signed)
Continue same regimen 

## 2020-03-05 NOTE — Progress Notes (Signed)
Subjective:    Patient ID: Carol [name], female    DOB: 04-05-1991, 29 y.o.   MRN: [id number]  HPI  ROV 03/05/20 --29 year old woman with longstanding asthma and allergies.  Her last pulmonary function testing was 2015 which showed grossly normal airflows.  I had a video visit with her in February at which time she had been experiencing flaring symptoms but it was difficult to tell whether this was mostly upper airway in nature as opposed to true asthma.  She had been changed to Symbicort from Flovent, remains on this.  She is on Flonase, Singulair, Allegra.  Also on Prilosec 20 mg daily - new med. She is using an adjustable bed to sleep at an incline. Also she has moved which has been beneficial, fewer exposures. She has had some symptoms with exposures to scents, smoke. Has not required any pred for 6 months. She uses duoneb prn.  She has been COVID vaccinated.    Review of Systems  Constitutional: Negative for fever and unexpected weight change.  HENT: Negative for congestion, dental problem, ear pain, nosebleeds, postnasal drip, rhinorrhea, sinus pressure, sneezing, sore throat and trouble swallowing.   Eyes: Negative for redness and itching.  Respiratory: Positive for chest tightness and shortness of breath. Negative for cough and wheezing.   Cardiovascular: Negative for palpitations and leg swelling.  Gastrointestinal: Negative for nausea and vomiting.  Genitourinary: Negative for dysuria.  Musculoskeletal: Negative for joint swelling.  Skin: Negative for rash.  Neurological: Positive for headaches.  Hematological: Does not bruise/bleed easily.  Psychiatric/Behavioral: Negative for dysphoric mood. The patient is not nervous/anxious.        Objective:   Physical Exam Vitals:   03/05/20 1404  BP: 130/76  Pulse: 94  Temp: (!) 97.2 F (36.2 C)  TempSrc: Temporal  SpO2: 98%  Weight: 177 lb 9.6 oz (80.6 kg)  Height: 5\' 3"  (1.6 m)   Gen: Pleasant, well-nourished, in no  distress,  normal affect  ENT: No lesions,  mouth clear,  oropharynx clear, no postnasal drip  Neck: No JVD, no stridor  Lungs: No use of accessory muscles, no whezes  Cardiovascular: RRR, heart sounds normal, no murmur or gallops, no peripheral edema  Musculoskeletal: No deformities, no cyanosis or clubbing  Neuro: alert, non focal  Skin: Warm, no lesions or rashes      Assessment & Plan:  Severe persistent asthma without complication Still difficult to discern how much of her syndrome is upper airway irritation versus true asthma.  She likely has both.I think now that she is well controlled, has not had prednisone a time recently be a good time to check her eosinophil count, IgE.  Also good time to evaluate pulmonary function testing.  Since her spirometry has been normal in the past I have recommended a methacholine challenge.  She has some trepidation about this because she is concerned that it will trigger dyspnea, flaring.  I think that this can be overcome if we use albuterol judiciously if she shows evidence of bronchospasm.  We will check the cost to her before officially scheduling. Continue controlled GERD, allergic rhinitis.  She has benefited since this has been more successful.  We will arrange for methacholine pulmonary function testing. We will not schedule until we confirm the self-pay cost for you.   We will check blood work today to evaluate eosinophil count and IgE  Follow with Dr. next available with full pulmonary function testing on the same day.  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  Gastroesophageal reflux disease Currently on a PPI but trying to come off with the assistance of her other physicians.  Continue elevating head of bed, diet control.  Ultimately she may need to go back on the PPI if her asthma suffers once it is stopped.  Seasonal allergies Continue same regimen  Levy Pupa, MD, PhD 03/05/2020,  2:30 PM [facility] [phone] or if no answer [phone]

## 2020-03-05 NOTE — Assessment & Plan Note (Addendum)
Still difficult to discern how much of her syndrome is upper airway irritation versus true asthma.  She likely has both.I think now that she is well controlled, has not had prednisone a time recently be a good time to check her eosinophil count, IgE.  Also good time to evaluate pulmonary function testing.  Since her spirometry has been normal in the past I have recommended a methacholine challenge.  She has some trepidation about this because she is concerned that it will trigger dyspnea, flaring.  I think that this can be overcome if we use albuterol judiciously if she shows evidence of bronchospasm.  We will check the cost to her before officially scheduling. Continue controlled GERD, allergic rhinitis.  She has benefited since this has been more successful.  We will arrange for methacholine pulmonary function testing. We will not schedule until we confirm the self-pay cost for you.   We will check blood work today to evaluate eosinophil count and IgE  Follow with Dr. Delton Coombes next available with full pulmonary function testing on the same day.  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

## 2020-03-05 NOTE — Addendum Note (Signed)
Addended by: Dorisann Frames R on: 03/05/2020 02:55 PM   Modules accepted: Orders

## 2020-03-05 NOTE — Addendum Note (Signed)
Addended by: Demetrio Lapping E on: 03/05/2020 02:55 PM   Modules accepted: Orders

## 2020-03-06 ENCOUNTER — Encounter: Payer: Self-pay | Admitting: Physician Assistant

## 2020-03-06 DIAGNOSIS — Z713 Dietary counseling and surveillance: Secondary | ICD-10-CM | POA: Diagnosis not present

## 2020-03-06 LAB — IGE: IgE (Immunoglobulin E), Serum: 73 kU/L (ref <=114)

## 2020-03-14 ENCOUNTER — Other Ambulatory Visit: Payer: Self-pay

## 2020-03-14 ENCOUNTER — Encounter: Payer: Self-pay | Admitting: Physician Assistant

## 2020-03-14 ENCOUNTER — Ambulatory Visit: Payer: BLUE CROSS/BLUE SHIELD | Admitting: Physician Assistant

## 2020-03-14 VITALS — BP 131/64 | HR 94 | Ht 63.0 in | Wt 175.0 lb

## 2020-03-14 DIAGNOSIS — J4550 Severe persistent asthma, uncomplicated: Secondary | ICD-10-CM | POA: Diagnosis not present

## 2020-03-14 DIAGNOSIS — N6310 Unspecified lump in the right breast, unspecified quadrant: Secondary | ICD-10-CM | POA: Diagnosis not present

## 2020-03-14 MED ORDER — PREDNISONE 20 MG PO TABS: ORAL_TABLET | ORAL | 0 refills | Status: DC

## 2020-03-14 NOTE — Progress Notes (Signed)
   Subjective:    Patient ID: Carol [name], female    DOB: 1990/12/21, 29 y.o.   MRN: [id number]  HPI  Pt is a 29 yo female who noticed a right breast lump in feb 2021. It has stayed the same size and it does not hurt. There is no warmth surrounding it. She has no family history of BC but her mother has had lumpy breast.    She is doing good with breathing and had no asthma attacks in a few months. She does not have prednisone taper to have at house if anything were to happen.   .. Active Ambulatory Problems    Diagnosis Date Noted  . Severe persistent asthma without complication 10/03/2016  . Irregular periods/menstrual cycles 10/03/2016  . Nausea 01/01/2018  . Low energy 01/01/2018  . Seasonal allergies 10/04/2018  . Gastroesophageal reflux disease 01/03/2020   Resolved Ambulatory Problems    Diagnosis Date Noted  . No Resolved Ambulatory Problems   Past Medical History:  Diagnosis Date  . Asthma   . Heart murmur   . Seizures (HCC)      Review of Systems See HPI.     Objective:   Physical Exam Vitals reviewed.  Constitutional:      Appearance: Normal appearance.  Cardiovascular:     Rate and Rhythm: Normal rate and regular rhythm.     Pulses: Normal pulses.     Heart sounds: Normal heart sounds.  Pulmonary:     Effort: Pulmonary effort is normal.     Breath sounds: Normal breath sounds.  Chest:    Neurological:     General: No focal deficit present.     Mental Status: She is alert and oriented to person, place, and time.  Psychiatric:        Mood and Affect: Mood normal.           Assessment & Plan:  Marland KitchenMarland KitchenDonovan was seen today for breast mass.  Diagnoses and all orders for this visit:  Lump of right breast -     MM Digital Diagnostic Unilat R -     US BREAST LTD UNI RIGHT INC AXILLA  Severe persistent asthma without complication -     predniSONE (DELTASONE) 20 MG tablet; Take 3 tablets for 3 days, take 2 tablets for 3 days, take 1 tablet for 3  days, take 1/2 tablet for 4 days.   Suspect superficial nodule. Will get breast imaging.   Written letter to get 3rd covid vaccine due to her immunocompromised state and high risk.  Wrote letter for employer to allow virtual interaction when possible.

## 2020-03-22 ENCOUNTER — Encounter: Payer: Self-pay | Admitting: Physician Assistant

## 2020-03-23 ENCOUNTER — Encounter: Payer: Self-pay | Admitting: Physician Assistant

## 2020-03-23 NOTE — Telephone Encounter (Signed)
I don't see where this was ordered. Pended. Please advise.

## 2020-04-04 DIAGNOSIS — Z713 Dietary counseling and surveillance: Secondary | ICD-10-CM | POA: Diagnosis not present

## 2020-04-12 ENCOUNTER — Encounter: Payer: Self-pay | Admitting: Physician Assistant

## 2020-04-13 ENCOUNTER — Other Ambulatory Visit: Payer: Self-pay

## 2020-04-13 ENCOUNTER — Ambulatory Visit
Admission: RE | Admit: 2020-04-13 | Discharge: 2020-04-13 | Disposition: A | Discharge status: Home / Self Care | Payer: BLUE CROSS/BLUE SHIELD | Source: Ambulatory Visit | Attending: Physician Assistant | Admitting: Physician Assistant

## 2020-04-13 DIAGNOSIS — N6001 Solitary cyst of right breast: Secondary | ICD-10-CM | POA: Diagnosis not present

## 2020-04-13 DIAGNOSIS — R922 Inconclusive mammogram: Secondary | ICD-10-CM | POA: Diagnosis not present

## 2020-04-13 NOTE — Progress Notes (Signed)
Sebaceous cyst. If you would like it removed it can be derm or here. Does not have to be removed.

## 2020-04-19 ENCOUNTER — Telehealth (INDEPENDENT_AMBULATORY_CARE_PROVIDER_SITE_OTHER): Payer: BLUE CROSS/BLUE SHIELD | Admitting: Physician Assistant

## 2020-04-19 ENCOUNTER — Other Ambulatory Visit: Payer: Self-pay

## 2020-04-19 VITALS — HR 95

## 2020-04-19 DIAGNOSIS — J4550 Severe persistent asthma, uncomplicated: Secondary | ICD-10-CM | POA: Diagnosis not present

## 2020-04-19 DIAGNOSIS — B370 Candidal stomatitis: Secondary | ICD-10-CM

## 2020-04-19 DIAGNOSIS — Z7189 Other specified counseling: Secondary | ICD-10-CM

## 2020-04-19 DIAGNOSIS — Z7185 Encounter for immunization safety counseling: Secondary | ICD-10-CM

## 2020-04-19 MED ORDER — PREDNISONE 20 MG PO TABS: ORAL_TABLET | ORAL | 0 refills | Status: DC

## 2020-04-19 MED ORDER — FLUCONAZOLE 150 MG PO TABS: 150.0000 mg | ORAL_TABLET | Freq: Once | ORAL | 0 refills | Status: AC

## 2020-04-19 NOTE — Progress Notes (Signed)
Asthma flare one week ago, started on prednisone.  Getting white patches in the back of her throat from the inhaled medication.  Taking duoneb every 3-4 hours.

## 2020-04-23 ENCOUNTER — Encounter: Payer: Self-pay | Admitting: Physician Assistant

## 2020-04-23 NOTE — Progress Notes (Signed)
Patient ID: Carol [name], female   DOB: 12-Apr-1991, 29 y.o.   MRN: [id number] .Marland KitchenVirtual Visit via Video Note  I connected with [name] on 04/19/2020 at 11:30 AM EDT by a video enabled telemedicine application and verified that I am speaking with the correct person using two identifiers.  Location: Patient: office Provider: clinic   I discussed the limitations of evaluation and management by telemedicine and the availability of in person appointments. The patient expressed understanding and agreed to proceed.  History of Present Illness: Patient is a 29 year old female who has severe persistent asthma and sees pulmonology that had to start a prednisone pack for an asthma flare about 1 week ago.  This is been her first flare in over 7 months.  She is on Symbicort , Singulair, Allegra, Flonase daily.  She is recently had to be using her DuoNeb every 3-4 hours.  She has not been able to taper off her 40 mg dose and going to run out of prednisone.  She needs an extension.  She is also noted a few white spots in the back of her throat and a lot of throat irritation.  She is concerned about thrush with all the prednisone.  Wants to know when to get flu shot.    .. Active Ambulatory Problems    Diagnosis Date Noted  . Severe persistent asthma without complication 10/03/2016  . Irregular periods/menstrual cycles 10/03/2016  . Nausea 01/01/2018  . Low energy 01/01/2018  . Seasonal allergies 10/04/2018  . Gastroesophageal reflux disease 01/03/2020   Resolved Ambulatory Problems    Diagnosis Date Noted  . No Resolved Ambulatory Problems   Past Medical History:  Diagnosis Date  . Asthma   . Heart murmur   . Seizures (HCC)    Reviewed med, allergy, problem list.   Observations/Objective: No acute distress Dry cough on video No wheezing  .Marland Kitchen Today's Vitals   04/19/20 1131  Pulse: 95  SpO2: 96%   There is no height or weight on file to calculate BMI.    Assessment and  Plan: Marland KitchenMarland KitchenDiagnoses and all orders for this visit:  Thrush -     fluconazole (DIFLUCAN) 150 MG tablet; Take 1 tablet (150 mg total) by mouth once for 1 dose. Repeat in 3 days.  Severe persistent asthma without complication -     predniSONE (DELTASONE) 20 MG tablet; Take 1 and 1/2 tablet for 4 days take 1 tablet for 4 days and take 1/2 tablet for 4 days.  Vaccine counseling   Please continue to follow-up with pulmonology to be instructed on how best to treat flares.  I did send over some warm prednisone to do a slower taper.  Continue on daily management.  There could be a strong possibility of thrush.  I did send oral Diflucan.  Attempted to send a mouthwash but doing an interaction review showed some anaphylaxis to a component in the mouthwash.  We will hold on this for now.  Follow-up if white spots in throat and symptoms do not resolve.  Encouraged to get flu shot 2 weeks after prednisone finished for best immune response.   Follow Up Instructions:    I discussed the assessment and treatment plan with the patient. The patient was provided an opportunity to ask questions and all were answered. The patient agreed with the plan and demonstrated an understanding of the instructions.   The patient was advised to call back or seek an in-person evaluation if the symptoms worsen or if the  condition fails to improve as anticipated.  I provided 20 minutes of non-face-to-face time during this encounter.   Tandy Gaw, PA-C

## 2020-04-25 DIAGNOSIS — Z713 Dietary counseling and surveillance: Secondary | ICD-10-CM | POA: Diagnosis not present

## 2020-05-02 ENCOUNTER — Telehealth (INDEPENDENT_AMBULATORY_CARE_PROVIDER_SITE_OTHER): Payer: BLUE CROSS/BLUE SHIELD | Admitting: Physician Assistant

## 2020-05-02 VITALS — Temp 98.7°F | Ht 63.0 in | Wt 165.0 lb

## 2020-05-02 DIAGNOSIS — J3089 Other allergic rhinitis: Secondary | ICD-10-CM | POA: Diagnosis not present

## 2020-05-02 DIAGNOSIS — J4550 Severe persistent asthma, uncomplicated: Secondary | ICD-10-CM

## 2020-05-02 DIAGNOSIS — Z91018 Allergy to other foods: Secondary | ICD-10-CM | POA: Diagnosis not present

## 2020-05-02 MED ORDER — FAMOTIDINE 20 MG PO TABS: 20.0000 mg | ORAL_TABLET | Freq: Two times a day (BID) | ORAL | 3 refills | Status: DC

## 2020-05-02 NOTE — Progress Notes (Signed)
Started 04/12/2020 - asthma flare Finished prednisone yesterday Nebulizer still twice daily Continued chest pain/SOB/easily winded/cough

## 2020-05-02 NOTE — Progress Notes (Signed)
Patient ID: Carol [name], female   DOB: 17-May-1991, 29 y.o.   MRN: [id number] .Marland KitchenVirtual Visit via Video Note  I connected with Carol [name] on 05/02/2020 at  8:10 AM EDT by a video enabled telemedicine application and verified that I am speaking with the correct person using two identifiers.  Location: Patient: work Provider: clinic   I discussed the limitations of evaluation and management by telemedicine and the availability of in person appointments. The patient expressed understanding and agreed to proceed.  History of Present Illness: Pt is 29 female with severe asthma and allergies who calls into the clinic for follow up. She has had a long exacerbation and just finished a prednisone taper yesterday. She is much better but not as good as she usually is finishing prednisone. She continues to use duoneb twice a day. She wants to know if that is ok. She has not called pulmonology. She wonders if she could get a second opinion on her dx with pulmonology. She did get her MRT blood test back and several allergies appeared with food but also red food dye. She made some med changes due to this information. She is also working with nutritionist to help make food changes. No overt problems breathing but dry cough and just still feels a little tight in chest.    .. Active Ambulatory Problems    Diagnosis Date Noted  . Severe persistent asthma without complication 10/03/2016  . Irregular periods/menstrual cycles 10/03/2016  . Nausea 01/01/2018  . Low energy 01/01/2018  . Environmental and seasonal allergies 10/04/2018  . Gastroesophageal reflux disease 01/03/2020  . Multiple food allergies 05/04/2020   Resolved Ambulatory Problems    Diagnosis Date Noted  . No Resolved Ambulatory Problems   Past Medical History:  Diagnosis Date  . Asthma   . Heart murmur   . Seizures (HCC)    Reviewed med, allergy, problem list.       Observations/Objective: No acute distress No labored  breathing Dry cough on video encounter No wheezing Normal mood and appearance.   .. Today's Vitals   05/02/20 0757  Temp: 98.7 F (37.1 C)  TempSrc: Oral  Weight: 165 lb (74.8 kg)  Height: 5\' 3"  (1.6 m)   Body mass index is 29.23 kg/m.    Assessment and Plan: Marland KitchenSayla was seen today for asthma.  Diagnoses and all orders for this visit:  Severe persistent asthma without complication  Multiple food allergies -     famotidine (PEPCID) 20 MG tablet; Take 1 tablet (20 mg total) by mouth 2 (two) times daily.  Environmental and seasonal allergies -     famotidine (PEPCID) 20 MG tablet; Take 1 tablet (20 mg total) by mouth 2 (two) times daily.  Needs to reach out to pulmonology for follow up. Ok to ask for different provider in group.  Stay on symbicort consider switching to [facility].  Continue on BRAND allegra and singulair.  Continue duoneb as needed. Continue to work with nutritionist for food allergies.     Follow Up Instructions:    I discussed the assessment and treatment plan with the patient. The patient was provided an opportunity to ask questions and all were answered. The patient agreed with the plan and demonstrated an understanding of the instructions.   The patient was advised to call back or seek an in-person evaluation if the symptoms worsen or if the condition fails to improve as anticipated.     [facility], PA-C

## 2020-05-04 ENCOUNTER — Encounter: Payer: Self-pay | Admitting: Physician Assistant

## 2020-05-04 DIAGNOSIS — Z91018 Allergy to other foods: Secondary | ICD-10-CM | POA: Insufficient documentation

## 2020-05-07 DIAGNOSIS — M549 Dorsalgia, unspecified: Secondary | ICD-10-CM | POA: Diagnosis not present

## 2020-05-07 DIAGNOSIS — M545 Low back pain, unspecified: Secondary | ICD-10-CM | POA: Diagnosis not present

## 2020-05-07 DIAGNOSIS — M542 Cervicalgia: Secondary | ICD-10-CM | POA: Diagnosis not present

## 2020-05-28 DIAGNOSIS — M549 Dorsalgia, unspecified: Secondary | ICD-10-CM | POA: Diagnosis not present

## 2020-05-28 DIAGNOSIS — M542 Cervicalgia: Secondary | ICD-10-CM | POA: Diagnosis not present

## 2020-05-28 DIAGNOSIS — M545 Low back pain, unspecified: Secondary | ICD-10-CM | POA: Diagnosis not present

## 2020-05-29 DIAGNOSIS — Z713 Dietary counseling and surveillance: Secondary | ICD-10-CM | POA: Diagnosis not present

## 2020-06-25 DIAGNOSIS — Z713 Dietary counseling and surveillance: Secondary | ICD-10-CM | POA: Diagnosis not present

## 2020-06-28 ENCOUNTER — Other Ambulatory Visit: Payer: Self-pay | Admitting: Physician Assistant

## 2020-06-28 DIAGNOSIS — N926 Irregular menstruation, unspecified: Secondary | ICD-10-CM

## 2020-10-16 ENCOUNTER — Ambulatory Visit: Payer: BLUE CROSS/BLUE SHIELD | Admitting: Physician Assistant

## 2020-10-17 ENCOUNTER — Telehealth: Payer: BLUE CROSS/BLUE SHIELD | Admitting: Physician Assistant

## 2020-10-17 VITALS — Temp 98.5°F | Ht 63.0 in | Wt 165.0 lb

## 2020-10-17 DIAGNOSIS — J3089 Other allergic rhinitis: Secondary | ICD-10-CM | POA: Diagnosis not present

## 2020-10-17 DIAGNOSIS — J4551 Severe persistent asthma with (acute) exacerbation: Secondary | ICD-10-CM | POA: Diagnosis not present

## 2020-10-17 MED ORDER — MONTELUKAST SODIUM 10 MG PO TABS: 10.0000 mg | ORAL_TABLET | Freq: Every day | ORAL | 3 refills | Status: DC

## 2020-10-17 MED ORDER — PREDNISONE 20 MG PO TABS: ORAL_TABLET | ORAL | 0 refills | Status: DC

## 2020-10-17 MED ORDER — CETIRIZINE HCL 10 MG PO TABS: 10.0000 mg | ORAL_TABLET | Freq: Every day | ORAL | 3 refills | Status: DC

## 2020-10-17 NOTE — Progress Notes (Signed)
Feels like asthma is getting worse On Symbicort BID, Singulair at night Wearing a mask everywhere (even outside to help with pollen) Having to use rescue inhaler or nebulizer daily Wanting to know if there is anything else for her to do to help

## 2020-10-17 NOTE — Progress Notes (Signed)
Patient ID: Carol [name], female   DOB: Oct 15, 1990, 30 y.o.   MRN: [id number] .Marland KitchenVirtual Visit via Video Note  I connected with Carol [name] on 10/17/2020 at  7:30 AM EDT by a video enabled telemedicine application and verified that I am speaking with the correct person using two identifiers.  Location: Patient: work Provider: clinic  .Marland KitchenParticipating in visit:  Patient: Carol [name] Provider: Tandy Gaw PA-C   I discussed the limitations of evaluation and management by telemedicine and the availability of in person appointments. The patient expressed understanding and agreed to proceed.  History of Present Illness: Patient is a 30 year old female with severe persistent asthma and environmental and seasonal allergies.  She does have a pulmonologist that she sees.  She is taking Singulair, Zyrtec, Symbicort, DuoNeb's.  She is relatively controlled.  She has not had a prednisone treated asthma exacerbation in 5 months.  She has had a few episodes of tightness which she resolved with as needed DuoNeb.  She has noticed a lot more wheezing, chest tightness, difficulty breathing with the spring season. She feels she needs prednisone to help stop the exacerbation.   .. Active Ambulatory Problems    Diagnosis Date Noted  . Severe persistent asthma without complication 10/03/2016  . Irregular periods/menstrual cycles 10/03/2016  . Nausea 01/01/2018  . Low energy 01/01/2018  . Environmental and seasonal allergies 10/04/2018  . Gastroesophageal reflux disease 01/03/2020  . Multiple food allergies 05/04/2020   Resolved Ambulatory Problems    Diagnosis Date Noted  . No Resolved Ambulatory Problems   Past Medical History:  Diagnosis Date  . Asthma   . Heart murmur   . Seizures (HCC)    Reviewed med,  Observations/Objective: No acute distress No labored breathing on video No cough or wheezing heard.  Normal mood and appearance.   .. Today's Vitals   10/17/20 0710  Temp: 98.5 F (36.9  C)  TempSrc: Oral  Weight: 165 lb (74.8 kg)  Height: 5\' 3"  (1.6 m)   Body mass index is 29.23 kg/m.    Assessment and Plan: Marland KitchenAnisia was seen today for asthma.  Diagnoses and all orders for this visit:  Severe persistent asthma with acute exacerbation -     predniSONE (DELTASONE) 20 MG tablet; Take 3 tablets for 3 days, take for 2 tablets for 3 days, take 1 tablet for 3 days, take 1/2 tablet for 4 days. -     montelukast (SINGULAIR) 10 MG tablet; Take 1 tablet (10 mg total) by mouth at bedtime. -     cetirizine (ZYRTEC) 10 MG tablet; Take 1 tablet (10 mg total) by mouth daily.  Environmental and seasonal allergies -     montelukast (SINGULAIR) 10 MG tablet; Take 1 tablet (10 mg total) by mouth at bedtime. -     cetirizine (ZYRTEC) 10 MG tablet; Take 1 tablet (10 mg total) by mouth daily.   Increase in pollen has been problematic for asthma. Treated for exacerbation with prednisone taper. It has been 5 months since last exacerbation which is great.  Continue zyrtec and singulair. Add allegra in the morning.  Continue with duoneb as needed.  Continue with symbicort. Discussed switching to [place].  Continue to follow up with pulmonology.    Follow Up Instructions:    I discussed the assessment and treatment plan with the patient. The patient was provided an opportunity to ask questions and all were answered. The patient agreed with the plan and demonstrated an understanding of the instructions.   The patient  was advised to call back or seek an in-person evaluation if the symptoms worsen or if the condition fails to improve as anticipated.     Tandy Gaw, PA-C

## 2020-10-22 ENCOUNTER — Encounter: Payer: Self-pay | Admitting: Physician Assistant

## 2020-11-10 ENCOUNTER — Encounter: Payer: Self-pay | Admitting: Physician Assistant

## 2020-11-10 DIAGNOSIS — J4550 Severe persistent asthma, uncomplicated: Secondary | ICD-10-CM

## 2020-11-13 MED ORDER — ALBUTEROL SULFATE HFA 108 (90 BASE) MCG/ACT IN AERS: 2.0000 | INHALATION_SPRAY | Freq: Four times a day (QID) | RESPIRATORY_TRACT | 1 refills | Status: DC | PRN

## 2020-11-26 DIAGNOSIS — L720 Epidermal cyst: Secondary | ICD-10-CM | POA: Diagnosis not present

## 2020-11-30 ENCOUNTER — Encounter: Payer: Self-pay | Admitting: Physician Assistant

## 2020-12-22 ENCOUNTER — Emergency Department (INDEPENDENT_AMBULATORY_CARE_PROVIDER_SITE_OTHER): Payer: BLUE CROSS/BLUE SHIELD

## 2020-12-22 ENCOUNTER — Encounter: Payer: Self-pay | Admitting: Emergency Medicine

## 2020-12-22 ENCOUNTER — Ambulatory Visit: Payer: Self-pay

## 2020-12-22 ENCOUNTER — Emergency Department (INDEPENDENT_AMBULATORY_CARE_PROVIDER_SITE_OTHER)
Admission: EM | Admit: 2020-12-22 | Discharge: 2020-12-22 | Disposition: A | Discharge status: Home / Self Care | Payer: BLUE CROSS/BLUE SHIELD | Source: Home / Self Care

## 2020-12-22 DIAGNOSIS — M25512 Pain in left shoulder: Secondary | ICD-10-CM

## 2020-12-22 DIAGNOSIS — M62838 Other muscle spasm: Secondary | ICD-10-CM

## 2020-12-22 MED ORDER — BACLOFEN 10 MG PO TABS: 10.0000 mg | ORAL_TABLET | Freq: Three times a day (TID) | ORAL | 0 refills | Status: DC

## 2020-12-22 MED ORDER — METHYLPREDNISOLONE ACETATE 80 MG/ML IJ SUSP
80.0000 mg | Freq: Once | INTRAMUSCULAR | Status: AC
  Administered 2020-12-22: 80 mg via INTRAMUSCULAR

## 2020-12-22 NOTE — Discharge Instructions (Addendum)
Advised patient to take medication as directed.  Encouraged patient to increase daily water intake while taking baclofen daily, or as needed.  Advised patient once affected area results to gradually start gentle stretching and range of motion exercises of affected area of left posterior shoulder and left trapezius groups.

## 2020-12-22 NOTE — ED Triage Notes (Signed)
Patient states that she injured her left rotator cuff putting her shirt on on Tuesday.  Patient has been alternating ice and heat.  Pain is radiating up her neck.  Patient is currently in a sling.  Patient denies OTC pain meds due to sensitivity.

## 2020-12-22 NOTE — ED Provider Notes (Signed)
[facility]    CSN: [id number] Arrival date & time: 12/22/20  1443      History   Chief Complaint Chief Complaint  Patient presents with  . left arm injury  . Appointment    HPI Carol [name] is a 30 y.o. female.   HPI 30 year old female presents with left shoulder pain reports that she injured her left rotator cuff putting on a shirt on Tuesday of this week.  Patient is currently in a sling.  Past Medical History:  Diagnosis Date  . Asthma   . Heart murmur   . Seizures [facility])     Patient Active Problem List   Diagnosis Date Noted  . Multiple food allergies 05/04/2020  . Gastroesophageal reflux disease 01/03/2020  . Environmental and seasonal allergies 10/04/2018  . Nausea 01/01/2018  . Low energy 01/01/2018  . Severe persistent asthma without complication 10/03/2016  . Irregular periods/menstrual cycles 10/03/2016    Past Surgical History:  Procedure Laterality Date  . TEAR DUCT PROBING  1991  . WISDOM TOOTH EXTRACTION  2016    OB History   No obstetric history on file.      Home Medications    Prior to Admission medications   Medication Sig Start Date End Date Taking? Authorizing Provider  albuterol (VENTOLIN HFA) 108 (90 Base) MCG/ACT inhaler Inhale 2 puffs into the lungs every 6 (six) hours as needed. 11/13/20  Yes Breeback, Jade L, PA-C  baclofen (LIORESAL) 10 MG tablet Take 1 tablet (10 mg total) by mouth 3 (three) times daily. 12/22/20  Yes Trevor Iha, FNP  budesonide-formoterol (SYMBICORT) 160-4.5 MCG/ACT inhaler Inhale 2 puffs into the lungs 2 (two) times daily. 12/30/19  Yes Breeback, Jade L, PA-C  cetirizine (ZYRTEC) 10 MG tablet Take 1 tablet (10 mg total) by mouth daily. 10/17/20  Yes Breeback, Jade L, PA-C  famotidine (PEPCID) 20 MG tablet Take 1 tablet (20 mg total) by mouth 2 (two) times daily. 05/02/20  Yes Breeback, Jade L, PA-C  Fexofenadine HCl (ALLEGRA PO) Take by mouth daily.   Yes [provider]  fluticasone  (FLONASE) 50 MCG/ACT nasal spray Place 2 sprays into both nostrils daily. 01/03/20  Yes Breeback, Jade L, PA-C  ipratropium-albuterol (DUONEB) 0.5-2.5 (3) MG/3ML SOLN Take 3 mLs by nebulization every 4 (four) hours as needed. 01/03/20  Yes Breeback, Jade L, PA-C  JUNEL 1.5/30 1.5-30 MG-MCG tablet TAKE 1 TABLET BY MOUTH  DAILY 06/29/20  Yes Breeback, Jade L, PA-C  levalbuterol (XOPENEX) 0.63 MG/3ML nebulizer solution USE 1 VIAL  IN NEBULIZER EVERY 6 HOURS AS NEEDED FOR  WHEEZING  OR  SHORTNESS  OF  BREATH 06/30/19  Yes Breeback, Jade L, PA-C  montelukast (SINGULAIR) 10 MG tablet Take 1 tablet (10 mg total) by mouth at bedtime. 10/17/20  Yes Breeback, Jade L, PA-C  Multiple Vitamin (MULTIVITAMIN) capsule Take by mouth.    Yes [provider]  predniSONE (DELTASONE) 20 MG tablet Take 3 tablets for 3 days, take for 2 tablets for 3 days, take 1 tablet for 3 days, take 1/2 tablet for 4 days. 10/17/20  Yes Breeback, Lonna Cobb, PA-C    Family History Family History  Problem Relation Age of Onset  . Heart murmur Mother   . Asthma Father   . Hyperlipidemia Paternal Grandmother   . Hypertension Paternal Grandmother   . Hyperlipidemia Paternal Grandfather   . Hypertension Paternal Grandfather     Social History Social History   Tobacco Use  . Smoking status: Never  Smoker  . Smokeless tobacco: Never Used  Substance Use Topics  . Alcohol use: No  . Drug use: No     Allergies   Cephalosporins, Ethyl alcohol (skin cleanser), Latex, Fd&c green #3 [green dye], Food color red [red dye], and Other   Review of Systems Review of Systems  Constitutional: Negative.   HENT: Negative.   Eyes: Negative.   Respiratory: Negative.   Cardiovascular: Negative.   Gastrointestinal: Negative.   Endocrine: Negative.   Genitourinary: Negative.   Musculoskeletal:       Left shoulder pain  Skin: Negative.   Neurological: Negative.      Physical Exam Triage Vital Signs ED Triage Vitals  Enc Vitals  Group     BP 12/22/20 1543 126/86     Pulse Rate 12/22/20 1543 82     Resp --      Temp --      Temp src --      SpO2 12/22/20 1543 99 %     Weight --      Height --      Head Circumference --      Peak Flow --      Pain Score 12/22/20 1545 7     Pain Loc --      Pain Edu? --      Excl. in GC? --    No data found.  Updated Vital Signs BP 126/86 (BP Location: Right Arm)   Pulse 82   LMP 12/22/2020   SpO2 99%      Physical Exam Vitals and nursing note reviewed.  Constitutional:      General: She is not in acute distress.    Appearance: Normal appearance. She is normal weight. She is not ill-appearing.  HENT:     Head: Normocephalic and atraumatic.     Mouth/Throat:     Mouth: Mucous membranes are moist.     Pharynx: Oropharynx is clear.  Eyes:     Extraocular Movements: Extraocular movements intact.     Conjunctiva/sclera: Conjunctivae normal.     Pupils: Pupils are equal, round, and reactive to light.  Cardiovascular:     Rate and Rhythm: Normal rate and regular rhythm.     Pulses: Normal pulses.     Heart sounds: Normal heart sounds. No murmur heard.   Pulmonary:     Effort: Pulmonary effort is normal.     Breath sounds: Normal breath sounds. No wheezing, rhonchi or rales.  Musculoskeletal:     Cervical back: Normal range of motion and neck supple. No tenderness.     Comments: Left shoulder: Non-TTP over AC and GH joints, FROM with flexion/extension, internal and external rotation, scapular retraction, horizontal abduction/adduction.  Left trapezius (superior lateral aspect): Multiple palpable muscle adhesions noted with moderate muscle tension  Lymphadenopathy:     Cervical: No cervical adenopathy.  Skin:    General: Skin is warm and dry.  Neurological:     General: No focal deficit present.     Mental Status: She is alert and oriented to person, place, and time.  Psychiatric:        Mood and Affect: Mood normal.        Behavior: Behavior normal.       UC Treatments / Results  Labs (all labs ordered are listed, but only abnormal results are displayed) Labs Reviewed - No data to display  EKG   Radiology DG Shoulder Left  Result Date: 12/22/2020 CLINICAL DATA:  Left shoulder pain, no  known injury, initial encounter EXAM: LEFT SHOULDER - 2+ VIEW COMPARISON:  None. FINDINGS: There is no evidence of fracture or dislocation. There is no evidence of arthropathy or other focal bone abnormality. Soft tissues are unremarkable. IMPRESSION: No acute abnormality is noted. Electronically Signed   By: Alcide Clever M.D.   On: 12/22/2020 16:22    Procedures Procedures (including critical care time)  Medications Ordered in UC Medications  methylPREDNISolone acetate (DEPO-MEDROL) injection 80 mg (80 mg Intramuscular Given 12/22/20 1647)    Initial Impression / Assessment and Plan / UC Course  I have reviewed the triage vital signs and the nursing notes.  Pertinent labs & imaging results that were available during my care of the patient were reviewed by me and considered in my medical decision making (see chart for details).     MDM: 1.  Left shoulder pain, 2.  Trapezius spasm.  Patient discharged home, hemodynamically stable. Final Clinical Impressions(s) / UC Diagnoses   Final diagnoses:  Acute pain of left shoulder  Trapezius muscle spasm     Discharge Instructions     Advised patient to take medication as directed.  Encouraged patient to increase daily water intake while taking baclofen daily, or as needed.  Advised patient once affected area results to gradually start gentle stretching and range of motion exercises of affected area of left posterior shoulder and left trapezius groups.    ED Prescriptions    Medication Sig Dispense Auth. Provider   baclofen (LIORESAL) 10 MG tablet Take 1 tablet (10 mg total) by mouth 3 (three) times daily. 30 each Trevor Iha, FNP     PDMP not reviewed this encounter.   Trevor Iha, FNP 12/22/20 1745

## 2020-12-25 ENCOUNTER — Other Ambulatory Visit: Payer: Self-pay

## 2020-12-25 ENCOUNTER — Ambulatory Visit: Payer: BLUE CROSS/BLUE SHIELD | Admitting: Physician Assistant

## 2020-12-25 VITALS — BP 132/62 | HR 74 | Ht 63.0 in | Wt 172.0 lb

## 2020-12-25 DIAGNOSIS — M25512 Pain in left shoulder: Secondary | ICD-10-CM | POA: Diagnosis not present

## 2020-12-25 DIAGNOSIS — M62838 Other muscle spasm: Secondary | ICD-10-CM | POA: Diagnosis not present

## 2020-12-25 MED ORDER — DICLOFENAC SODIUM 1 % EX GEL: 4.0000 g | Freq: Four times a day (QID) | CUTANEOUS | 1 refills | Status: DC

## 2020-12-25 NOTE — Patient Instructions (Addendum)
Icy hot patches.  Consider heating pad.  Continue to do stretches. May do diclofenac gel 4 times a day.  Get a massage focused on upper neck.  Tens units.

## 2020-12-25 NOTE — Progress Notes (Signed)
   Subjective:    Patient ID: Carol [name], female    DOB: February 12, 1991, 30 y.o.   MRN: [id number]  HPI  Pt is a 30 yo female with sudden left upper back/shoulder pain that started a few days ago after putting her shirt on. No other injury. She went to [facility] on 5/28. Normal xray. Given stretches and muscle relaxers with little benefit.  . Active Ambulatory Problems    Diagnosis Date Noted  . Severe persistent asthma without complication 10/03/2016  . Irregular periods/menstrual cycles 10/03/2016  . Nausea 01/01/2018  . Low energy 01/01/2018  . Environmental and seasonal allergies 10/04/2018  . Gastroesophageal reflux disease 01/03/2020  . Multiple food allergies 05/04/2020  . Trapezius muscle spasm 12/26/2020  . Acute pain of left shoulder 12/26/2020   Resolved Ambulatory Problems    Diagnosis Date Noted  . No Resolved Ambulatory Problems   Past Medical History:  Diagnosis Date  . Asthma   . Heart murmur   . Seizures (HCC)     Review of Systems See HPI.     Objective:   Physical Exam Vitals reviewed.  Constitutional:      Appearance: Normal appearance.  HENT:     Head: Normocephalic.  Musculoskeletal:     Comments: Pain with movement of head to the right.  Pain with abduction past 90 degrees.  Negative drop arm sign.  Pain with internal and external ROM.  Strength 5/5.  No tenderness or pain over AC joint.   Neurological:     Mental Status: She is alert.           Assessment & Plan:  Marland KitchenMarland KitchenDonesha was seen today for shoulder pain and neck pain.  Diagnoses and all orders for this visit:  Trapezius muscle spasm -     diclofenac Sodium (VOLTAREN) 1 % GEL; Apply 4 g topically 4 (four) times daily. To affected joint.  Acute pain of left shoulder -     diclofenac Sodium (VOLTAREN) 1 % GEL; Apply 4 g topically 4 (four) times daily. To affected joint.   Reassured patient that xray done showed no acute bony abnormalities.  Discussed with patient will take some time  to completely heal.  Cannot tolerate oral NsAIDs.  Consider icy hot patches, heating pads, stretches, massage, tens unit and diclofenac gel.  Follow up if not improving or symptoms worsening.

## 2020-12-26 ENCOUNTER — Encounter: Payer: Self-pay | Admitting: Physician Assistant

## 2020-12-26 DIAGNOSIS — M25512 Pain in left shoulder: Secondary | ICD-10-CM | POA: Insufficient documentation

## 2020-12-26 DIAGNOSIS — M62838 Other muscle spasm: Secondary | ICD-10-CM | POA: Insufficient documentation

## 2020-12-31 ENCOUNTER — Telehealth: Payer: Self-pay | Admitting: Emergency Medicine

## 2020-12-31 NOTE — Telephone Encounter (Signed)
ATC, left VM. 

## 2020-12-31 NOTE — Telephone Encounter (Signed)
Spoke with pt . She has seen Dr Delton Coombes in the past but would like to establish with a new Dr. She does not have a preference. Pt would need an afternoon appt 2:00 or after. Will send to Dr Delton Coombes first for his approval.

## 2020-12-31 NOTE — Telephone Encounter (Signed)
Sure no problem

## 2020-12-31 NOTE — Telephone Encounter (Signed)
Dr Celine Mans- this pt has Asthma. Would you like her to establish care with you?

## 2020-12-31 NOTE — Telephone Encounter (Signed)
Yes - ok with me for her to change. Need to confirm with the new provider that they are ok with taking her on

## 2021-01-02 NOTE — Telephone Encounter (Signed)
Pt scheduled with Dr. Celine Mans in 01/2021. Will close encounter.

## 2021-01-25 ENCOUNTER — Encounter: Payer: Self-pay | Admitting: Physician Assistant

## 2021-02-07 ENCOUNTER — Other Ambulatory Visit: Payer: Self-pay

## 2021-02-07 ENCOUNTER — Ambulatory Visit: Payer: BLUE CROSS/BLUE SHIELD | Admitting: Internal Medicine

## 2021-02-07 ENCOUNTER — Encounter: Payer: Self-pay | Admitting: Internal Medicine

## 2021-02-07 VITALS — BP 102/62 | HR 96 | Temp 99.0°F | Ht 62.5 in | Wt 173.0 lb

## 2021-02-07 DIAGNOSIS — J3089 Other allergic rhinitis: Secondary | ICD-10-CM | POA: Diagnosis not present

## 2021-02-07 DIAGNOSIS — J4550 Severe persistent asthma, uncomplicated: Secondary | ICD-10-CM | POA: Diagnosis not present

## 2021-02-07 MED ORDER — DEXLANSOPRAZOLE 30 MG PO CPDR: 30.0000 mg | DELAYED_RELEASE_CAPSULE | Freq: Every day | ORAL | 3 refills | Status: DC

## 2021-02-07 MED ORDER — DEXLANSOPRAZOLE 30 MG PO CPDR: 30.0000 mg | DELAYED_RELEASE_CAPSULE | Freq: Every day | ORAL | 0 refills | Status: DC

## 2021-02-07 MED ORDER — FLUTICASONE-SALMETEROL 250-50 MCG/ACT IN AEPB: 1.0000 | INHALATION_SPRAY | Freq: Two times a day (BID) | RESPIRATORY_TRACT | 3 refills | Status: DC

## 2021-02-07 NOTE — Patient Instructions (Addendum)
Please schedule follow up scheduled with myself in 3 months.  If my schedule is not open yet, we will contact you with a reminder closer to that time.  Before your next visit I would like you to have: Full set of PFTs with exhaled NO - whenever available,A  Biologic therapy for asthma - look into: Tespire  Dupixent  Stop symbicort. Switch to advair. Gargle with alcohol based mouthwash to prevent thrush.   Instructions For Advair Diskus Take the ADVAIR DISKUS out of the box and foil overwrap pouch. Write the "Pouch opened" and "Use by" dates on the label on top of the DISKUS. The "Use by" date is 1 month from date of opening the pouch.     * The DISKUS will be in the closed position when the pouch is opened.     * The dose indicator on the top of the DISKUS tells you how many doses are left. The dose indicator number will decrease each time you use the DISKUS. After you have used 55 doses from the DISKUS, the numbers 5 to 0 will appear in red to warn you that there are only a few doses left. If you are using a "sample" DISKUS, the numbers 5 to 0 will appear in red after 23 doses.  Taking a dose from the DISKUS requires the following 3 simple steps: Open, Click, Inhale.  OPEN Hold the DISKUS in one hand and put the thumb of your other hand on the thumbgrip. Push your thumb away from you as far as it will go until the mouthpiece appears and snaps into position.  2.   CLICK Hold the DISKUS in a level, flat position with the mouthpiece toward you. Slide the lever away from you as far as it will go until it clicks. The DISKUS is now ready to use. Every time the lever is pushed back, a dose is ready to be inhaled. This is shown by a decrease in numbers on the dose counter. To avoid releasing or wasting doses once the DISKUS is ready: Do not close the DISKUS. Do not tilt the DISKUS. Do not play with the lever. Do not move the lever more than once.  3.   INHALE Before inhaling your dose from the  DISKUS, breathe out (exhale) fully while holding the DISKUS level and away from your mouth. Remember, never breathe out into the DISKUS mouthpiece.  Put the mouthpiece to your lips. Breathe in quickly and deeply through the DISKUS. Do not breathe in through your nose.  Remove the DISKUS from your mouth. Hold your breath for about 10 seconds, or for as long as is comfortable. Breathe out slowly.  The DISKUS delivers your dose of medicine as a very fine powder. Most patients can taste or feel the powder. Do not use another dose from the DISKUS if you do not feel or taste the medicine.  Rinse your mouth with water after breathing in the medicine. Spit the water out. Do not swallow.  4.  CLOSE the DISKUS when you are finished taking a dose so that the DISKUS will be ready for you to take your next dose. Put your thumb on the thumbgrip and slide the thumbgrip back toward you as far as it will go. The DISKUS will click shut. The lever will automatically return to its original position. The DISKUS is now ready for you to take your next scheduled dose, due in about 12 hours. (Repeat the steps 1 to 4.)  REMEMBER:  Never breathe into the DISKUS. Never take the DISKUS apart. Always ready and use the DISKUS in a level, flat position. Do not use the DISKUS with a spacer device. After each dose, rinse your mouth with water and spit the water out. Do not swallow. Never wash the mouthpiece or any part of the DISKUS. Keep it dry. Always keep the DISKUS in a dry place. Never take an extra dose, even if you did not taste or feel the medicine.

## 2021-02-07 NOTE — Progress Notes (Signed)
       Carol [name]    [id number]    09/22/1990  Primary Care Physician:Breeback, Jade L, PA-C Date of Appointment: 02/07/2021 Established Patient Visit  Chief complaint:   Chief Complaint  Patient presents with   Consult    Establish with Lung provider, Patient wants to discuss other inhaler options.     HPI: Carol [name] is a 30 y.o. woman with asthma. Former patient of Dr. Byrum.  Interval Updates: Here to establish care with me today. Diagnosed in childhood since age 1. Father has asthma as well.   Usually needs long tapers of prednisone when her asthma flares up. Symptoms are shortness of breath, chest tightness, rather than coughing. She has trouble thinking and has chest pain  Uses levalbuterol in the nebulizer Also has ipratropium as needed which seems to work better than levalbuterol.   Having weight gain since she's been on symbicort, she feels tight on symbicort as well.    Has been on serovent, flovent, advair in the past.   Her asthma was better controlled before high school. Since high school she has needed prednisone 2-3 times a year. She did well on advair for a while  She moved form an apartment to a single family home and installing hepa filters as well as moving helped a lot.   Current Regimen: Symbicort 2 puffs BID, ventolin, duoneb Asthma Triggers: scents, strong smells, smoke, perfume, spring and fall environmental allergies Exacerbations in the last year: yes, needs prednisone every 4-5 months.  History of hospitalization or intubation: never Allergy Testing: dust mites, birds, cats dogs GERD: yes on pepcid. 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  No pets at home.  Never smoker  I have reviewed the patient's family social and past medical history and  updated as appropriate.   Past Medical History:  Diagnosis Date   Asthma    Heart murmur    Seizures (HCC)     Past Surgical History:  Procedure Laterality Date   TEAR DUCT PROBING  1991   WISDOM TOOTH EXTRACTION  2016    Family History  Problem Relation Age of Onset   Heart murmur Mother    Asthma Father    Hyperlipidemia Paternal Grandmother    Hypertension Paternal Grandmother    Hyperlipidemia Paternal Grandfather    Hypertension Paternal Grandfather     Social History   Occupational History   Not on file  Tobacco Use   Smoking status: Never   Smokeless tobacco: Never  Substance and Sexual Activity   Alcohol use: No   Drug use: No   Sexual activity: Never     Physical Exam: Blood pressure 102/62, pulse 96, temperature 99 F (37.2 C), temperature source Oral, height 5' 2.5" (1.588 m), weight 173 lb (78.5 kg), SpO2 96 %.  Gen:      No acute distress ENT:  no nasal polyps, mucus membranes moist Lungs:    No increased respiratory effort, symmetric chest wall excursion, clear to auscultation bilaterally, no wheezes or crackles CV:         tachycardic, regular    Data Reviewed: Imaging: I have personally reviewed the chest xray Sept 2018 shows no acute cardiopulmonary process  PFTs: No flowsheet data found. I have personally reviewed the patient's PFTs and normal pulmonary   function in 2015 performed at [facility]  Labs: Reviewed in care everywhere No eosinophilia IgE elevated 73 in August 2021 Region 2 allergy panel sensitivity to dogs, dust mites, cats  Immunization status: Immunization History  Administered Date(s) Administered   DTaP 03/11/1989, 05/06/1989, 06/30/1989, 08/03/1990, 02/08/1993, 06/01/2002   Hepatitis B 09/05/1996, 11/11/1996, 03/10/1997   IPV 03/11/1989, 05/12/1989, 08/03/1990   Influenza Inj Mdck Quad Pf 04/07/2019   Influenza Split 05/03/2014, 04/24/2017   Influenza,inj,Quad PF,6+ Mos 04/24/2015   Influenza,inj,Quad PF,6-35 Mos  03/29/2019   Influenza-Unspecified 08/12/2011, 05/04/2012, 04/25/2013, 04/15/2018, 04/07/2019, 05/16/2020   MMR 02/10/1990, 02/08/1993   Meningococcal Conjugate 10/29/2006   PFIZER Comirnaty(Gray Top)Covid-19 Tri-Sucrose Vaccine 12/08/2020   PFIZER(Purple Top)SARS-COV-2 Vaccination 10/06/2019, 10/27/2019, 05/07/2020   Pneumococcal Polysaccharide-23 04/24/2015   Tdap 03/09/2014   Varicella 12/26/1997    Assessment:  Severe Persistent Asthma, not well controlled with recurrent exacerbations requiring oral glucocorticoids GERD  Plan/Recommendations:  Carol [name] has poorly controlled asthma. Will increase ICS to advair since she has been on this in the past and done well. Stop symbicort.  Continue prn albuterol, singulair, zyrtec, prn duonebs, xopanex Her reflux is poorly controlled and we will try dexilant since omeprazole/pantoprazole haven't been effective and have had adverse effects. Will obtain repeat PFTs and FeNO prior to next appointment. Based on her poorly controlled symptoms I requiring oral glucocorticoids she is a candidate for dupixent or tespire. She may also have co-morbid conditions with asthma mimickers including laryngeal obstruction. She will think about biologic therapy and will discuss at next visit.   Return to Care: Return in about 3 months (around 05/10/2021).   Carol Desai, MD Pulmonary and Critical Care Medicine [facility] Office:[phone]      

## 2021-02-13 DIAGNOSIS — M62830 Muscle spasm of back: Secondary | ICD-10-CM | POA: Diagnosis not present

## 2021-02-13 DIAGNOSIS — M542 Cervicalgia: Secondary | ICD-10-CM | POA: Diagnosis not present

## 2021-02-13 DIAGNOSIS — M546 Pain in thoracic spine: Secondary | ICD-10-CM | POA: Diagnosis not present

## 2021-02-13 DIAGNOSIS — M5413 Radiculopathy, cervicothoracic region: Secondary | ICD-10-CM | POA: Diagnosis not present

## 2021-02-14 DIAGNOSIS — M546 Pain in thoracic spine: Secondary | ICD-10-CM | POA: Diagnosis not present

## 2021-02-14 DIAGNOSIS — M542 Cervicalgia: Secondary | ICD-10-CM | POA: Diagnosis not present

## 2021-02-14 DIAGNOSIS — M5413 Radiculopathy, cervicothoracic region: Secondary | ICD-10-CM | POA: Diagnosis not present

## 2021-02-14 DIAGNOSIS — M62830 Muscle spasm of back: Secondary | ICD-10-CM | POA: Diagnosis not present

## 2021-02-15 DIAGNOSIS — M5413 Radiculopathy, cervicothoracic region: Secondary | ICD-10-CM | POA: Diagnosis not present

## 2021-02-15 DIAGNOSIS — M542 Cervicalgia: Secondary | ICD-10-CM | POA: Diagnosis not present

## 2021-02-15 DIAGNOSIS — M62830 Muscle spasm of back: Secondary | ICD-10-CM | POA: Diagnosis not present

## 2021-02-15 DIAGNOSIS — M546 Pain in thoracic spine: Secondary | ICD-10-CM | POA: Diagnosis not present

## 2021-02-16 ENCOUNTER — Encounter: Payer: Self-pay | Admitting: Physician Assistant

## 2021-02-18 DIAGNOSIS — M5413 Radiculopathy, cervicothoracic region: Secondary | ICD-10-CM | POA: Diagnosis not present

## 2021-02-18 DIAGNOSIS — M62830 Muscle spasm of back: Secondary | ICD-10-CM | POA: Diagnosis not present

## 2021-02-18 DIAGNOSIS — M542 Cervicalgia: Secondary | ICD-10-CM | POA: Diagnosis not present

## 2021-02-18 DIAGNOSIS — M546 Pain in thoracic spine: Secondary | ICD-10-CM | POA: Diagnosis not present

## 2021-02-18 NOTE — Telephone Encounter (Signed)
Yes I am good with letter. She has multiple allergies that can induce severe asthma exacerbations. 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 Medical opinion she should be excused from jury duty.

## 2021-02-20 DIAGNOSIS — M542 Cervicalgia: Secondary | ICD-10-CM | POA: Diagnosis not present

## 2021-02-20 DIAGNOSIS — M546 Pain in thoracic spine: Secondary | ICD-10-CM | POA: Diagnosis not present

## 2021-02-20 DIAGNOSIS — M62830 Muscle spasm of back: Secondary | ICD-10-CM | POA: Diagnosis not present

## 2021-02-20 DIAGNOSIS — M5413 Radiculopathy, cervicothoracic region: Secondary | ICD-10-CM | POA: Diagnosis not present

## 2021-02-21 DIAGNOSIS — M546 Pain in thoracic spine: Secondary | ICD-10-CM | POA: Diagnosis not present

## 2021-02-21 DIAGNOSIS — M62830 Muscle spasm of back: Secondary | ICD-10-CM | POA: Diagnosis not present

## 2021-02-21 DIAGNOSIS — M542 Cervicalgia: Secondary | ICD-10-CM | POA: Diagnosis not present

## 2021-02-21 DIAGNOSIS — M5413 Radiculopathy, cervicothoracic region: Secondary | ICD-10-CM | POA: Diagnosis not present

## 2021-02-25 DIAGNOSIS — M546 Pain in thoracic spine: Secondary | ICD-10-CM | POA: Diagnosis not present

## 2021-02-25 DIAGNOSIS — M5413 Radiculopathy, cervicothoracic region: Secondary | ICD-10-CM | POA: Diagnosis not present

## 2021-02-25 DIAGNOSIS — M62830 Muscle spasm of back: Secondary | ICD-10-CM | POA: Diagnosis not present

## 2021-02-25 DIAGNOSIS — M542 Cervicalgia: Secondary | ICD-10-CM | POA: Diagnosis not present

## 2021-02-27 DIAGNOSIS — M542 Cervicalgia: Secondary | ICD-10-CM | POA: Diagnosis not present

## 2021-02-27 DIAGNOSIS — M62830 Muscle spasm of back: Secondary | ICD-10-CM | POA: Diagnosis not present

## 2021-02-27 DIAGNOSIS — M546 Pain in thoracic spine: Secondary | ICD-10-CM | POA: Diagnosis not present

## 2021-02-27 DIAGNOSIS — M5413 Radiculopathy, cervicothoracic region: Secondary | ICD-10-CM | POA: Diagnosis not present

## 2021-02-28 DIAGNOSIS — M546 Pain in thoracic spine: Secondary | ICD-10-CM | POA: Diagnosis not present

## 2021-02-28 DIAGNOSIS — M542 Cervicalgia: Secondary | ICD-10-CM | POA: Diagnosis not present

## 2021-02-28 DIAGNOSIS — M5413 Radiculopathy, cervicothoracic region: Secondary | ICD-10-CM | POA: Diagnosis not present

## 2021-02-28 DIAGNOSIS — M62830 Muscle spasm of back: Secondary | ICD-10-CM | POA: Diagnosis not present

## 2021-03-01 ENCOUNTER — Other Ambulatory Visit: Payer: Self-pay | Admitting: Internal Medicine

## 2021-03-04 DIAGNOSIS — M542 Cervicalgia: Secondary | ICD-10-CM | POA: Diagnosis not present

## 2021-03-04 DIAGNOSIS — M546 Pain in thoracic spine: Secondary | ICD-10-CM | POA: Diagnosis not present

## 2021-03-04 DIAGNOSIS — M62830 Muscle spasm of back: Secondary | ICD-10-CM | POA: Diagnosis not present

## 2021-03-04 DIAGNOSIS — M5413 Radiculopathy, cervicothoracic region: Secondary | ICD-10-CM | POA: Diagnosis not present

## 2021-03-05 DIAGNOSIS — M62830 Muscle spasm of back: Secondary | ICD-10-CM | POA: Diagnosis not present

## 2021-03-05 DIAGNOSIS — M546 Pain in thoracic spine: Secondary | ICD-10-CM | POA: Diagnosis not present

## 2021-03-05 DIAGNOSIS — M5413 Radiculopathy, cervicothoracic region: Secondary | ICD-10-CM | POA: Diagnosis not present

## 2021-03-05 DIAGNOSIS — M542 Cervicalgia: Secondary | ICD-10-CM | POA: Diagnosis not present

## 2021-03-07 DIAGNOSIS — M542 Cervicalgia: Secondary | ICD-10-CM | POA: Diagnosis not present

## 2021-03-07 DIAGNOSIS — M62830 Muscle spasm of back: Secondary | ICD-10-CM | POA: Diagnosis not present

## 2021-03-07 DIAGNOSIS — M5413 Radiculopathy, cervicothoracic region: Secondary | ICD-10-CM | POA: Diagnosis not present

## 2021-03-07 DIAGNOSIS — M546 Pain in thoracic spine: Secondary | ICD-10-CM | POA: Diagnosis not present

## 2021-03-12 DIAGNOSIS — M546 Pain in thoracic spine: Secondary | ICD-10-CM | POA: Diagnosis not present

## 2021-03-12 DIAGNOSIS — M542 Cervicalgia: Secondary | ICD-10-CM | POA: Diagnosis not present

## 2021-03-12 DIAGNOSIS — M5413 Radiculopathy, cervicothoracic region: Secondary | ICD-10-CM | POA: Diagnosis not present

## 2021-03-12 DIAGNOSIS — M62830 Muscle spasm of back: Secondary | ICD-10-CM | POA: Diagnosis not present

## 2021-03-26 DIAGNOSIS — M5413 Radiculopathy, cervicothoracic region: Secondary | ICD-10-CM | POA: Diagnosis not present

## 2021-03-26 DIAGNOSIS — M542 Cervicalgia: Secondary | ICD-10-CM | POA: Diagnosis not present

## 2021-03-26 DIAGNOSIS — M546 Pain in thoracic spine: Secondary | ICD-10-CM | POA: Diagnosis not present

## 2021-03-26 DIAGNOSIS — M62830 Muscle spasm of back: Secondary | ICD-10-CM | POA: Diagnosis not present

## 2021-04-07 ENCOUNTER — Other Ambulatory Visit: Payer: Self-pay | Admitting: Internal Medicine

## 2021-04-09 DIAGNOSIS — M542 Cervicalgia: Secondary | ICD-10-CM | POA: Diagnosis not present

## 2021-04-09 DIAGNOSIS — M546 Pain in thoracic spine: Secondary | ICD-10-CM | POA: Diagnosis not present

## 2021-04-09 DIAGNOSIS — M5413 Radiculopathy, cervicothoracic region: Secondary | ICD-10-CM | POA: Diagnosis not present

## 2021-04-09 DIAGNOSIS — M62830 Muscle spasm of back: Secondary | ICD-10-CM | POA: Diagnosis not present

## 2021-05-10 ENCOUNTER — Other Ambulatory Visit: Payer: Self-pay | Admitting: Physician Assistant

## 2021-05-10 DIAGNOSIS — N926 Irregular menstruation, unspecified: Secondary | ICD-10-CM

## 2021-05-16 ENCOUNTER — Encounter: Payer: Self-pay | Admitting: Internal Medicine

## 2021-05-16 ENCOUNTER — Ambulatory Visit (INDEPENDENT_AMBULATORY_CARE_PROVIDER_SITE_OTHER): Payer: BLUE CROSS/BLUE SHIELD | Admitting: Internal Medicine

## 2021-05-16 ENCOUNTER — Other Ambulatory Visit: Payer: Self-pay

## 2021-05-16 VITALS — BP 126/80 | HR 89 | Temp 98.3°F | Ht 63.0 in | Wt 167.0 lb

## 2021-05-16 DIAGNOSIS — K219 Gastro-esophageal reflux disease without esophagitis: Secondary | ICD-10-CM | POA: Diagnosis not present

## 2021-05-16 DIAGNOSIS — J4550 Severe persistent asthma, uncomplicated: Secondary | ICD-10-CM

## 2021-05-16 LAB — POCT EXHALED NITRIC OXIDE: FeNO level (ppb): 11

## 2021-05-16 LAB — PULMONARY FUNCTION TEST
DL/VA % pred: 129 %
DL/VA: 5.93 ml/min/mmHg/L
DLCO cor % pred: 117 %
DLCO cor: 25.45 ml/min/mmHg
DLCO unc % pred: 117 %
DLCO unc: 25.45 ml/min/mmHg
FEF 25-75 Post: 4.98 L/sec
FEF 25-75 Pre: 4.86 L/sec
FEF2575-%Change-Post: 2 %
FEF2575-%Pred-Post: 149 %
FEF2575-%Pred-Pre: 145 %
FEV1-%Change-Post: 3 %
FEV1-%Pred-Post: 107 %
FEV1-%Pred-Pre: 104 %
FEV1-Post: 3.3 L
FEV1-Pre: 3.21 L
FEV1FVC-%Change-Post: -1 %
FEV1FVC-%Pred-Pre: 109 %
FEV6-%Change-Post: 5 %
FEV6-%Pred-Post: 101 %
FEV6-%Pred-Pre: 95 %
FEV6-Post: 3.65 L
FEV6-Pre: 3.46 L
FEV6FVC-%Pred-Post: 100 %
FEV6FVC-%Pred-Pre: 100 %
FVC-%Change-Post: 5 %
FVC-%Pred-Post: 100 %
FVC-%Pred-Pre: 95 %
FVC-Post: 3.65 L
FVC-Pre: 3.48 L
Post FEV1/FVC ratio: 90 %
Post FEV6/FVC ratio: 100 %
Pre FEV1/FVC ratio: 92 %
Pre FEV6/FVC Ratio: 100 %
RV % pred: 58 %
RV: 0.8 L
TLC % pred: 87 %
TLC: 4.3 L

## 2021-05-16 MED ORDER — DEXLANSOPRAZOLE 30 MG PO CPDR: 30.0000 mg | DELAYED_RELEASE_CAPSULE | Freq: Every day | ORAL | 3 refills | Status: DC

## 2021-05-16 NOTE — Progress Notes (Signed)
Carol [name]    [id number]    1990/08/07  Primary Care Physician:Breeback, Royetta Car, PA-C Date of Appointment: 05/16/2021 Established Patient Visit  Chief complaint:   Chief Complaint  Patient presents with   Follow-up    PFT results     HPI: Carol [name] is a 30 y.o. woman with severe persistent asthma, GERD.  Interval Updates: Here for follow up after PFTs. Has had three episodes triggered by scents which she was able to recover from with nebulizers. This has helped a lot. No prednisone since last visit! Allergies well controlled. Reflux also improved on dexilant  Current Regimen: advair 250 1 puff BID Asthma Triggers: scents, strong smells, smoke, perfume, spring and fall environmental allergies Exacerbations in the last year: last in may 2022. History of hospitalization or intubation:none Allergy Testing: on allegra, cetirizine, flonase, and montelukast.  GERD: yes  GERD controlled by dexilent.  Allergic Rhinitis: ACT:  Asthma Control Test ACT Total Score  02/07/2021 19  FenO: 11 ppb    I have reviewed the patient's family social and past medical history and updated as appropriate.   Past Medical History:  Diagnosis Date   Asthma    Heart murmur    Seizures ([place])     Past Surgical History:  Procedure Laterality Date   TEAR DUCT PROBING  1991   WISDOM TOOTH EXTRACTION  2016    Family History  Problem Relation Age of Onset   Heart murmur Mother    Asthma Father    Hyperlipidemia Paternal Grandmother    Hypertension Paternal Grandmother    Hyperlipidemia Paternal Grandfather    Hypertension Paternal Grandfather     Social History   Occupational History   Not on file  Tobacco Use   Smoking status: Never   Smokeless tobacco: Never  Substance and Sexual Activity   Alcohol use: No   Drug use: No   Sexual activity: Never     Physical Exam: Blood pressure 126/80, pulse 89, temperature 98.3 F (36.8 C), temperature source Oral,  height '5\' 3"'  (1.6 m), weight 167 lb (75.8 kg), SpO2 96 %.  Gen:      No acute distress ENT:  no nasal polyps, mucus membranes moist Lungs:    No increased respiratory effort, symmetric chest wall excursion, clear to auscultation bilaterally, no wheezes or crackles CV:         Regular rate and rhythm; no murmurs, rubs, or gallops.  No pedal edema   Data Reviewed: Imaging: I have personally reviewed the chest xray Sept 2018 -  no acute process  PFTs:  PFT Results Latest Ref Rng & Units 05/16/2021  FVC-Pre L 3.48  FVC-Predicted Pre % 95  FVC-Post L 3.65  FVC-Predicted Post % 100  Pre FEV1/FVC % % 92  Post FEV1/FCV % % 90  FEV1-Pre L 3.21  FEV1-Predicted Pre % 104  FEV1-Post L 3.30  DLCO uncorrected ml/min/mmHg 25.45  DLCO UNC% % 117  DLCO corrected ml/min/mmHg 25.45  DLCO COR %Predicted % 117  DLVA Predicted % 129  TLC L 4.30  TLC % Predicted % 87  RV % Predicted % 58   I have personally reviewed the patient's PFTs and show normal pulmonary function  Labs:  Immunization status: Immunization History  Administered Date(s) Administered   DTaP 03/11/1989, 05/06/1989, 06/30/1989, 08/03/1990, 02/08/1993, 06/01/2002   Hepatitis B 09/05/1996, 11/11/1996, 03/10/1997   IPV 03/11/1989, 05/12/1989, 08/03/1990   Influenza Inj Mdck Quad Pf 04/07/2019  Influenza Split 05/03/2014, 04/24/2017   Influenza,inj,Quad PF,6+ Mos 04/24/2015   Influenza,inj,Quad PF,6-35 Mos 03/29/2019, 05/11/2021   Influenza-Unspecified 08/12/2011, 05/04/2012, 04/25/2013, 04/15/2018, 04/07/2019, 05/16/2020   MMR 02/10/1990, 02/08/1993   Meningococcal Conjugate 10/29/2006   PFIZER Comirnaty(Gray Top)Covid-19 Tri-Sucrose Vaccine 12/08/2020   PFIZER(Purple Top)SARS-COV-2 Vaccination 10/06/2019, 10/27/2019, 05/07/2020   Pneumococcal Polysaccharide-23 04/24/2015   Tdap 03/09/2014   Varicella 12/26/1997    Assessment:  Severe persistent asthma - improved control GERD Chronic allergic  rhinitis   Plan/Recommendations: Continue advair 250 1 puff BID Continue dexilant.  Continue prn albuterol/duoneb Continue montelukast, antihistamines, flonase  Return to Care: Return in about 3 months (around 08/16/2021).   Lenice Llamas, MD Pulmonary and [place]

## 2021-05-16 NOTE — Patient Instructions (Signed)
Please schedule follow up scheduled with myself in 3 months.  If my schedule is not open yet, we will contact you with a reminder closer to that time.    

## 2021-05-16 NOTE — Progress Notes (Signed)
Full PFT and Feno completed today

## 2021-06-07 ENCOUNTER — Ambulatory Visit (INDEPENDENT_AMBULATORY_CARE_PROVIDER_SITE_OTHER): Payer: BLUE CROSS/BLUE SHIELD

## 2021-06-07 ENCOUNTER — Other Ambulatory Visit: Payer: Self-pay

## 2021-06-07 ENCOUNTER — Ambulatory Visit: Payer: BLUE CROSS/BLUE SHIELD | Admitting: Sports Medicine

## 2021-06-07 DIAGNOSIS — M25512 Pain in left shoulder: Secondary | ICD-10-CM | POA: Diagnosis not present

## 2021-06-07 DIAGNOSIS — M542 Cervicalgia: Secondary | ICD-10-CM

## 2021-06-07 NOTE — Progress Notes (Signed)
    Procedures performed today:    None.  Independent interpretation of notes and tests performed by another provider:   None.  Brief History, Exam, Impression, and Recommendations:    Acute pain of left shoulder This is a pleasant 30 year old female, for 6 months that she had pain in the left trapezius with occasional radiation to the periscapular region, worse with prolonged downgaze, she did have some shoulder x-rays done at urgent care that were unremarkable. Exam is benign. This is likely cervical spasm with underlying spondylosis, updated C-spine x-rays, we will take a nonpharmacologic approach, and do aggressive physical therapy, return to see me in approximately 6 weeks. MRI for interventional planning if no better.    ___________________________________________ Ihor Austin. Benjamin Stain, M.D., ABFM., CAQSM. Primary Care and Sports Medicine [facility]  Adjunct Instructor of Family Medicine  [employer]

## 2021-06-07 NOTE — Assessment & Plan Note (Signed)
This is a pleasant 30 year old female, for 6 months that she had pain in the left trapezius with occasional radiation to the periscapular region, worse with prolonged downgaze, she did have some shoulder x-rays done at urgent care that were unremarkable. Exam is benign. This is likely cervical spasm with underlying spondylosis, updated C-spine x-rays, we will take a nonpharmacologic approach, and do aggressive physical therapy, return to see me in approximately 6 weeks. MRI for interventional planning if no better.

## 2021-06-18 ENCOUNTER — Ambulatory Visit (INDEPENDENT_AMBULATORY_CARE_PROVIDER_SITE_OTHER): Payer: BLUE CROSS/BLUE SHIELD | Admitting: Rehabilitative and Restorative Service Providers"

## 2021-06-18 ENCOUNTER — Encounter: Payer: Self-pay | Admitting: Rehabilitative and Restorative Service Providers"

## 2021-06-18 ENCOUNTER — Other Ambulatory Visit: Payer: Self-pay

## 2021-06-18 DIAGNOSIS — R29898 Other symptoms and signs involving the musculoskeletal system: Secondary | ICD-10-CM | POA: Diagnosis not present

## 2021-06-18 DIAGNOSIS — M25512 Pain in left shoulder: Secondary | ICD-10-CM | POA: Diagnosis not present

## 2021-06-18 DIAGNOSIS — G8929 Other chronic pain: Secondary | ICD-10-CM | POA: Diagnosis not present

## 2021-06-18 DIAGNOSIS — R293 Abnormal posture: Secondary | ICD-10-CM

## 2021-06-18 NOTE — Therapy (Signed)
[facility] [address] [place] [place], [place], [address] Phone: [phone]   Fax:  [phone]  Physical Therapy Evaluation  Patient Details  Name: Carol [name] MRN: [id number] Date of Birth: [birth year] Referring Provider (PT): Dr Benjamin Stain   Encounter Date: 06/18/2021   PT End of Session - 06/18/21 1657     Visit Number 1    Number of Visits 12    Date for PT Re-Evaluation 07/30/21    PT Start Time 1515    PT Stop Time 1603    PT Time Calculation (min) 48 min    Activity Tolerance Patient tolerated treatment well             Past Medical History:  Diagnosis Date   Asthma    Heart murmur    Migraines    Seizures (HCC)     Past Surgical History:  Procedure Laterality Date   TEAR DUCT PROBING  1991   WISDOM TOOTH EXTRACTION  2016    There were no vitals filed for this visit.    Subjective Assessment - 06/18/21 1522     Subjective Patient reports that she has had Lt shoulder pain since May 2022. She has tried massage therapy with temporary improvement after increased pain for a day. Symptoms started again after 2-3 days. Now having constant pain in the Lt shoulder.    Pertinent History bilat knee injuries with dance ~ 15 yrs ago requiring extensive PT for ~ 1 yr; chronic lung condition - severe asthma (sleeps propped with head of the bed elevated at ~ 45 degrees)    Patient Stated Goals reduce Lt shoulder pain and return to normal use of arm    Currently in Pain? Yes    Pain Score 4     Pain Location Shoulder    Pain Orientation Left    Pain Descriptors / Indicators Burning    Pain Type Chronic pain    Pain Radiating Towards sometimes into the neck    Pain Onset More than a month ago    Pain Frequency Intermittent    Aggravating Factors  computer; stitching; shoulder forward postion; lying on Rt side    Pain Relieving Factors massage; rubbing shoulder; heat; sometimes stretching                 [facility] PT Assessment - 06/18/21 0001       Assessment   Medical Diagnosis Cervical dysfunction; Lt shoulder pain    Referring Provider (PT) Dr Benjamin Stain    Onset Date/Surgical Date 11/25/20    Hand Dominance Right;Left   writes with Rt; computer work Lt   Next MD Visit 07/19/21    Prior Therapy for knees when in HS      Precautions   Precautions None      Restrictions   Weight Bearing Restrictions No      Balance Screen   Has the patient fallen in the past 6 months No    Has the patient had a decrease in activity level because of a fear of falling?  No    Is the patient reluctant to leave their home because of a fear of falling?  No      Home [employer]ist information centre manager residence    Living Arrangements Alone      Prior Function   Level of Independence Independent    Vocation Full time employment    Vocation Requirements professor at [employer]    Leisure  kick boxing 3-4 x/wk 30/35 min non contact; hiking/power walking x/wk for 12 hours; cross stitching; reading; sings in a choir (holding music - 2x/wk)      Observation/Other Assessments   Focus on Therapeutic Outcomes (FOTO)  59      Sensation   Additional Comments WFL's per pt report      Posture/Postural Control   Posture Comments head forward; shoudlere rounded and elevated; head of the humerus anterior in orientation; scapulae abducted and rotated along the thoracic wall      AROM   Overall AROM Comments tight Lt > Rt shoudler AROM in flexion and abduction    Cervical Flexion 49 pulling    Cervical Extension 50    Cervical - Right Side Bend 41    Cervical - Left Side Bend 50 pinching Lt    Cervical - Right Rotation 61    Cervical - Left Rotation 72      Strength   Overall Strength Comments decreased postural strength      Palpation   Spinal mobility hypomobile thorugh the mid to upper thoracic and cervical spine with PA and Lt to Rt lateral mobs    Palpation comment muscular  tightness through the Lt > Rt pecs; upper trap; leveator; ant/lat/post cervical musculature; thoracic paraspinals                        Objective measurements completed on examination: See above findings.       [facility] Adult PT Treatment/Exercise - 06/18/21 0001       Self-Care   Other Self-Care Comments  myofacial ball release work in standing      Neuro Re-ed    Neuro Re-ed Details  initiated postural correction encouraging posterior shoulder girdle engagement      Shoulder Exercises: Standing   Other Standing Exercises axial extension 5 sec x 5 reps; scap squeeze 5 sec x 5 reps; L's x 10; W's x 10 with noodle along spine      Shoulder Exercises: Stretch   Other Shoulder Stretches doorway stretch three positions 30 sec hold x 2 reps                     PT Education - 06/18/21 1559     Education Details HEP POC office ergonomics; DN; TENs    Person(s) Educated Patient    Methods Explanation;Demonstration;Tactile cues;Verbal cues;Handout    Comprehension Verbalized understanding;Returned demonstration;Verbal cues required;Tactile cues required                 PT Long Term Goals - 06/18/21 1703       PT LONG TERM GOAL #1   Title Improve posture and alignment with patient to demonstrate improved upright posture with posterior shoulder girdle engaged    Time 6    Period Weeks    Status New    Target Date 07/30/21      PT LONG TERM GOAL #2   Title Increase cervical ROM to [facility] in all planes with no pain or pulling    Time 6    Period Weeks    Status New    Target Date 07/30/21      PT LONG TERM GOAL #3   Title Patient reports 75% improvement in return to normal work and recreational activities    Time 6    Period Weeks    Status New    Target Date 07/30/21      PT  LONG TERM GOAL #4   Title Independent in HEP    Time 6    Period Weeks    Status New    Target Date 07/30/21      PT LONG TERM GOAL #5   Title Improve functional  limitation score to 69    Time 6    Period Weeks    Status New    Target Date 07/30/21                    Plan - 06/18/21 1658     Clinical Impression Statement Patient presents with ~ 6 month history of Lt shoulder pain with no known injury. She has poor posture and alignment; forward work and leisure activities contributing to forward posture; limited cervical and shoulder mobility and ROM; muscular tightness thorough cervical and thoracic areas; pain with functional and recreational activities; limited functional activities. Patient will benefit from PT to address problems identified.    Stability/Clinical Decision Making Stable/Uncomplicated    Clinical Decision Making Low    Rehab Potential Good    PT Frequency 2x / week    PT Duration 6 weeks    PT Treatment/Interventions ADLs/Self Care Home Management;Aquatic Therapy;Cryotherapy;Electrical Stimulation;Iontophoresis 4mg /ml Dexamethasone;Moist Heat;Ultrasound;Functional mobility training;Therapeutic activities;Therapeutic exercise;Neuromuscular re-education;Patient/family education;Manual techniques;Passive range of motion;Dry needling;Taping    PT Next Visit Plan review HEP; progress with postural correction; trial of TENS for home purchase; DN and/or manual work; modalities as indicated    PT Home Exercise Plan [email or web address]    Consulted and Agree with Plan of Care Patient             Patient will benefit from skilled therapeutic intervention in order to improve the following deficits and impairments:  Decreased range of motion, Decreased activity tolerance, Pain, Hypomobility, Impaired flexibility, Improper body mechanics, Decreased mobility, Decreased strength, Postural dysfunction  Visit Diagnosis: Chronic left shoulder pain  Abnormal posture  Other symptoms and signs involving the musculoskeletal system     Problem List Patient Active Problem List   Diagnosis Date Noted   Trapezius muscle spasm 12/26/2020    Acute pain of left shoulder 12/26/2020   Multiple food allergies 05/04/2020   Gastroesophageal reflux disease 01/03/2020   Environmental and seasonal allergies 10/04/2018   Nausea 01/01/2018   Low energy 01/01/2018   Severe persistent asthma without complication 10/03/2016   Irregular periods/menstrual cycles 10/03/2016    Zedric Deroy 12/03/2016, PT, MPH  06/18/2021, 5:09 PM  [facility] [address] [place] [place], [place], [place] Phone: [phone]   Fax:  [phone]  Name: Carol [name] MRN: [name] Date of Birth: 1990-08-19

## 2021-06-18 NOTE — Patient Instructions (Signed)
Access Code: [email or web address] URL: [email or web address] Date: 06/18/2021 Prepared by: Corlis Leak  Exercises Seated Cervical Retraction - 3 x daily - 7 x weekly - 1 sets - 10 reps Standing Scapular Retraction - 3 x daily - 7 x weekly - 1 sets - 10 reps - 10 hold Shoulder External Rotation and Scapular Retraction - 3 x daily - 7 x weekly - 1 sets - 10 reps - hold Shoulder External Rotation in 45 Degrees Abduction - 2 x daily - 7 x weekly - 1-2 sets - 10 reps - 3 sec hold Doorway Pec Stretch at 60 Degrees Abduction - 3 x daily - 7 x weekly - 1 sets - 3 reps Doorway Pec Stretch at 90 Degrees Abduction - 3 x daily - 7 x weekly - 1 sets - 3 reps - 30 seconds hold Doorway Pec Stretch at 120 Degrees Abduction - 3 x daily - 7 x weekly - 1 sets - 3 reps - 30 second hold hold  Patient Education Office Posture TENS Unit Trigger Point Dry Needling

## 2021-06-24 ENCOUNTER — Ambulatory Visit (INDEPENDENT_AMBULATORY_CARE_PROVIDER_SITE_OTHER): Payer: BLUE CROSS/BLUE SHIELD | Admitting: Rehabilitative and Restorative Service Providers"

## 2021-06-24 ENCOUNTER — Other Ambulatory Visit: Payer: Self-pay

## 2021-06-24 ENCOUNTER — Encounter: Payer: Self-pay | Admitting: Rehabilitative and Restorative Service Providers"

## 2021-06-24 DIAGNOSIS — G8929 Other chronic pain: Secondary | ICD-10-CM | POA: Diagnosis not present

## 2021-06-24 DIAGNOSIS — R293 Abnormal posture: Secondary | ICD-10-CM | POA: Diagnosis not present

## 2021-06-24 DIAGNOSIS — M25512 Pain in left shoulder: Secondary | ICD-10-CM

## 2021-06-24 DIAGNOSIS — R29898 Other symptoms and signs involving the musculoskeletal system: Secondary | ICD-10-CM | POA: Diagnosis not present

## 2021-06-24 NOTE — Patient Instructions (Signed)

## 2021-06-24 NOTE — Therapy (Signed)
[facility] [place] [address] [place], [place], [address] Phone: [phone]   Fax:  [phone]  Physical Therapy Treatment  Patient Details  Name: Carol [name] MRN: [id number] Date of Birth: 10/06/1990 Referring Provider (PT): Dr Benjamin Stain   Encounter Date: 06/24/2021   PT End of Session - 06/24/21 0721     Visit Number 2    Number of Visits 12    Date for PT Re-Evaluation 07/30/21    PT Start Time 0716    PT Stop Time 0804    PT Time Calculation (min) 48 min    Activity Tolerance Patient tolerated treatment well             Past Medical History:  Diagnosis Date   Asthma    Heart murmur    Migraines    Seizures (HCC)     Past Surgical History:  Procedure Laterality Date   TEAR DUCT PROBING  1991   WISDOM TOOTH EXTRACTION  2016    There were no vitals filed for this visit.   Subjective Assessment - 06/24/21 0721     Subjective Patient reports that she has been doing the her exercises twice a day and when she can three times a day. Still does not love the chin tuck. Seems to feel a little bit better with less throbbing    Currently in Pain? Yes    Pain Score 4     Pain Location Shoulder    Pain Orientation Left    Pain Descriptors / Indicators Burning    Pain Type Chronic pain    Pain Onset More than a month ago    Pain Frequency Intermittent                               [facility] Adult PT Treatment/Exercise - 06/24/21 0001       Neuro Re-ed    Neuro Re-ed Details  working on UE movement while maintaining posterior shoulder girdle alignment/engaged      Shoulder Exercises: Standing   Other Standing Exercises axial extension 5 sec x 5 reps; scap squeeze 5 sec x 5 reps; L's x 10; W's x 10 with noodle along spine      Shoulder Exercises: Stretch   Other Shoulder Stretches doorway stretch three positions 30 sec hold x 2 reps      Moist Heat Therapy   Number Minutes Moist  Heat 10 Minutes    Moist Heat Location Cervical;Shoulder      Electrical Stimulation   Electrical Stimulation Location Lt posterior cervical to Lt upper trap; teres; middle scap    Electrical Stimulation Action TENS    Electrical Stimulation Parameters to tolerance    Electrical Stimulation Goals Pain;Tone      Manual Therapy   Manual therapy comments skilled palpation to assess response to DN and manual work    Soft tissue mobilization STM to pt tolerance through the posterior cervical and shoulder girdle area    Scapular Mobilization Lt              Trigger Point Dry Needling - 06/24/21 0001     Consent Given? Yes    Education Handout Provided Yes    Muscles Treated Head and Neck Upper trapezius    Upper Trapezius Response Palpable increased muscle length    Cervical multifidi Response Palpable increased muscle length    Teres major Response Palpable increased muscle length  Teres minor Response Palpable increased muscle length                   PT Education - 06/24/21 0729     Education Details DN    Methods Explanation;Handout    Comprehension Verbalized understanding                 PT Long Term Goals - 06/18/21 1703       PT LONG TERM GOAL #1   Title Improve posture and alignment with patient to demonstrate improved upright posture with posterior shoulder girdle engaged    Time 6    Period Weeks    Status New    Target Date 07/30/21      PT LONG TERM GOAL #2   Title Increase cervical ROM to [facility] in all planes with no pain or pulling    Time 6    Period Weeks    Status New    Target Date 07/30/21      PT LONG TERM GOAL #3   Title Patient reports 75% improvement in return to normal work and recreational activities    Time 6    Period Weeks    Status New    Target Date 07/30/21      PT LONG TERM GOAL #4   Title Independent in HEP    Time 6    Period Weeks    Status New    Target Date 07/30/21      PT LONG TERM GOAL #5   Title  Improve functional limitation score to 69    Time 6    Period Weeks    Status New    Target Date 07/30/21                   Plan - 06/24/21 0726     Clinical Impression Statement Patient is working on exercises consistently. She has noticed some less throbbing in the shoulder. Burning pain continues. Trial of Dn to posterior shoulder and cervical area followed by manual work. Treatment concluded with trial of TENS to address chronic pain and muscular tightness. Patient has difficutly tolerating prone and supine in flat positions.    Rehab Potential Good    PT Frequency 2x / week    PT Duration 6 weeks    PT Treatment/Interventions ADLs/Self Care Home Management;Aquatic Therapy;Cryotherapy;Electrical Stimulation;Iontophoresis 4mg /ml Dexamethasone;Moist Heat;Ultrasound;Functional mobility training;Therapeutic activities;Therapeutic exercise;Neuromuscular re-education;Patient/family education;Manual techniques;Passive range of motion;Dry needling;Taping    PT Next Visit Plan review HEP; progress with postural correction; assess response to trial of TENS for home purchase; assess response to trial of DN and/or manual work; modalities as indicated    PT Home Exercise Plan [email or web address]    Consulted and Agree with Plan of Care Patient             Patient will benefit from skilled therapeutic intervention in order to improve the following deficits and impairments:     Visit Diagnosis: Chronic left shoulder pain  Abnormal posture  Other symptoms and signs involving the musculoskeletal system     Problem List Patient Active Problem List   Diagnosis Date Noted   Trapezius muscle spasm 12/26/2020   Acute pain of left shoulder 12/26/2020   Multiple food allergies 05/04/2020   Gastroesophageal reflux disease 01/03/2020   Environmental and seasonal allergies 10/04/2018   Nausea 01/01/2018   Low energy 01/01/2018   Severe persistent asthma without complication 10/03/2016    Irregular periods/menstrual cycles 10/03/2016  Val Riles, PT, MPH 06/24/2021, 7:58 AM  [facility] [address] [place], [place], [address] Phone: [phone]   Fax:  [phone]  Name: Collins Dimaria MRN: [id number] Date of Birth: [birth year]

## 2021-06-28 ENCOUNTER — Encounter: Payer: BLUE CROSS/BLUE SHIELD | Admitting: Rehabilitative and Restorative Service Providers"

## 2021-07-01 ENCOUNTER — Encounter: Payer: Self-pay | Admitting: Rehabilitative and Restorative Service Providers"

## 2021-07-01 ENCOUNTER — Ambulatory Visit (INDEPENDENT_AMBULATORY_CARE_PROVIDER_SITE_OTHER): Payer: BLUE CROSS/BLUE SHIELD | Admitting: Rehabilitative and Restorative Service Providers"

## 2021-07-01 ENCOUNTER — Other Ambulatory Visit: Payer: Self-pay

## 2021-07-01 DIAGNOSIS — R29898 Other symptoms and signs involving the musculoskeletal system: Secondary | ICD-10-CM | POA: Diagnosis not present

## 2021-07-01 DIAGNOSIS — R293 Abnormal posture: Secondary | ICD-10-CM | POA: Diagnosis not present

## 2021-07-01 DIAGNOSIS — M25512 Pain in left shoulder: Secondary | ICD-10-CM

## 2021-07-01 DIAGNOSIS — G8929 Other chronic pain: Secondary | ICD-10-CM

## 2021-07-01 NOTE — Therapy (Signed)
[facility] [place] [address] [place], [place], [address] Phone: [phone]   Fax:  [phone]  Physical Therapy Treatment  Patient Details  Name: Carol [name] MRN: [id number] Date of Birth: 02/02/1991 Referring Provider (PT): Dr Benjamin Stain   Encounter Date: 07/01/2021   PT End of Session - 07/01/21 0722     Visit Number 3    Number of Visits 12    Date for PT Re-Evaluation 07/30/21    PT Start Time 0721    PT Stop Time 0810    PT Time Calculation (min) 49 min    Activity Tolerance Patient tolerated treatment well             Past Medical History:  Diagnosis Date   Asthma    Heart murmur    Migraines    Seizures (HCC)     Past Surgical History:  Procedure Laterality Date   TEAR DUCT PROBING  1991   WISDOM TOOTH EXTRACTION  2016    There were no vitals filed for this visit.   Subjective Assessment - 07/01/21 0723     Subjective Reaction to the DN  for about 3 hours - nausea and dizziness. Working on her exercises at home. Chin tuck is getting easier and can hold it longer.    Currently in Pain? Yes    Pain Score 3     Pain Location Shoulder    Pain Orientation Left    Pain Descriptors / Indicators Burning;Throbbing    Pain Type Chronic pain    Pain Onset More than a month ago    Pain Frequency Intermittent                               [facility] Adult PT Treatment/Exercise - 07/01/21 0001       Neuro Re-ed    Neuro Re-ed Details  working on UE movement while maintaining posterior shoulder girdle alignment/engaged      Shoulder Exercises: Standing   Other Standing Exercises axial extension 5 sec x 5 reps; scap squeeze 5 sec x 5 reps; L's x 10; W's x 10 with noodle along spine      Shoulder Exercises: Stretch   Other Shoulder Stretches doorway stretch three positions 30 sec hold x 2 reps      Moist Heat Therapy   Number Minutes Moist Heat 10 Minutes    Moist Heat Location  Cervical;Shoulder      Electrical Stimulation   Electrical Stimulation Location Lt posterior cervical to Lt upper trap; teres; middle scap    Electrical Stimulation Action TENS    Electrical Stimulation Parameters to tolerance    Electrical Stimulation Goals Pain;Tone      Manual Therapy   Joint Mobilization cervical and upper thoracic PA mobs    Soft tissue mobilization STM to pt tolerance through the posterior cervical and shoulder girdle area; pecs; anterior deltiod; teres    Scapular Mobilization Lt    Passive ROM cervical flexion 10-15 sec hold x 2 reps                          PT Long Term Goals - 06/18/21 1703       PT LONG TERM GOAL #1   Title Improve posture and alignment with patient to demonstrate improved upright posture with posterior shoulder girdle engaged    Time 6    Period Weeks  Status New    Target Date 07/30/21      PT LONG TERM GOAL #2   Title Increase cervical ROM to [facility] in all planes with no pain or pulling    Time 6    Period Weeks    Status New    Target Date 07/30/21      PT LONG TERM GOAL #3   Title Patient reports 75% improvement in return to normal work and recreational activities    Time 6    Period Weeks    Status New    Target Date 07/30/21      PT LONG TERM GOAL #4   Title Independent in HEP    Time 6    Period Weeks    Status New    Target Date 07/30/21      PT LONG TERM GOAL #5   Title Improve functional limitation score to 69    Time 6    Period Weeks    Status New    Target Date 07/30/21                   Plan - 07/01/21 0727     Clinical Impression Statement Patient experienced persistent nausea and dizziness for ~ 3 hours following trial of DN. Will hold DN at this time. Did well with TENS unit. Will assess response to manual work and continue with postural correction.    Rehab Potential Good    PT Frequency 2x / week    PT Duration 6 weeks    PT Treatment/Interventions ADLs/Self Care Home  Management;Aquatic Therapy;Cryotherapy;Electrical Stimulation;Iontophoresis 4mg /ml Dexamethasone;Moist Heat;Ultrasound;Functional mobility training;Therapeutic activities;Therapeutic exercise;Neuromuscular re-education;Patient/family education;Manual techniques;Passive range of motion;Dry needling;Taping    PT Next Visit Plan review HEP; progress with postural correction; TENS for home purchase; hold DN and/or manual work; modalities as indicated    PT Home Exercise Plan [email or web address]    Consulted and Agree with Plan of Care Patient             Patient will benefit from skilled therapeutic intervention in order to improve the following deficits and impairments:     Visit Diagnosis: Chronic left shoulder pain  Abnormal posture  Other symptoms and signs involving the musculoskeletal system     Problem List Patient Active Problem List   Diagnosis Date Noted   Trapezius muscle spasm 12/26/2020   Acute pain of left shoulder 12/26/2020   Multiple food allergies 05/04/2020   Gastroesophageal reflux disease 01/03/2020   Environmental and seasonal allergies 10/04/2018   Nausea 01/01/2018   Low energy 01/01/2018   Severe persistent asthma without complication 10/03/2016   Irregular periods/menstrual cycles 10/03/2016    [name] 12/03/2016, PT, MPH  07/01/2021, 7:59 AM  [facility] [address] [place], [place], [place] Phone: [phone]   Fax:  [phone]  Name: Carol [name] MRN: [name] Date of Birth: May 15, 1991

## 2021-07-03 ENCOUNTER — Other Ambulatory Visit: Payer: Self-pay

## 2021-07-03 ENCOUNTER — Encounter: Payer: Self-pay | Admitting: Rehabilitative and Restorative Service Providers"

## 2021-07-03 ENCOUNTER — Ambulatory Visit (INDEPENDENT_AMBULATORY_CARE_PROVIDER_SITE_OTHER): Payer: BLUE CROSS/BLUE SHIELD | Admitting: Rehabilitative and Restorative Service Providers"

## 2021-07-03 DIAGNOSIS — R293 Abnormal posture: Secondary | ICD-10-CM | POA: Diagnosis not present

## 2021-07-03 DIAGNOSIS — R29898 Other symptoms and signs involving the musculoskeletal system: Secondary | ICD-10-CM | POA: Diagnosis not present

## 2021-07-03 DIAGNOSIS — M25512 Pain in left shoulder: Secondary | ICD-10-CM

## 2021-07-03 DIAGNOSIS — G8929 Other chronic pain: Secondary | ICD-10-CM | POA: Diagnosis not present

## 2021-07-03 NOTE — Patient Instructions (Addendum)
Nerve stretch  Lying on back  Tuck tip and turn head to left  Bring right shoulder to 90 degrees, extend elbow and wrist pointing fingers to floor  Hold 1 in 2 reps 2 x/day  NO PAIN  Nerve glide  Arms at side head in midline  Bat balloon back and forth bending and extending wrists  10-20 reps 2x/day   Access Code: [email or web address] URL: [email or web address] Date: 07/03/2021 Prepared by: Corlis Leak  Exercises Seated Cervical Retraction - 3 x daily - 7 x weekly - 1 sets - 10 reps Standing Scapular Retraction - 3 x daily - 7 x weekly - 1 sets - 10 reps - 10 hold Shoulder External Rotation and Scapular Retraction - 3 x daily - 7 x weekly - 1 sets - 10 reps - hold Shoulder External Rotation in 45 Degrees Abduction - 2 x daily - 7 x weekly - 1-2 sets - 10 reps - 3 sec hold Doorway Pec Stretch at 60 Degrees Abduction - 3 x daily - 7 x weekly - 1 sets - 3 reps Doorway Pec Stretch at 90 Degrees Abduction - 3 x daily - 7 x weekly - 1 sets - 3 reps - 30 seconds hold Doorway Pec Stretch at 120 Degrees Abduction - 3 x daily - 7 x weekly - 1 sets - 3 reps - 30 second hold hold Standing Shoulder External Rotation with Resistance - 2 x daily - 7 x weekly - 1-3 sets - 10 reps - 2-3 sec hold Shoulder External Rotation in 45 Degrees Abduction - 2 x daily - 7 x weekly - 1-2 sets - 10 reps - 3 sec hold

## 2021-07-03 NOTE — Therapy (Signed)
[facility] [place] [address] [place] [address] [place], [place], [address] Phone: [phone]   Fax:  [phone]  Physical Therapy Treatment  Patient Details  Name: Carol [name] MRN: [id number] Date of Birth: 11-14-1990 Referring Provider (PT): Dr Benjamin Stain   Encounter Date: 07/03/2021   PT End of Session - 07/03/21 1604     Visit Number 4    Number of Visits 12    Date for PT Re-Evaluation 07/30/21    PT Start Time 1603    PT Stop Time 1647    PT Time Calculation (min) 44 min    Activity Tolerance Patient tolerated treatment well             Past Medical History:  Diagnosis Date   Asthma    Heart murmur    Migraines    Seizures (HCC)     Past Surgical History:  Procedure Laterality Date   TEAR DUCT PROBING  1991   WISDOM TOOTH EXTRACTION  2016    There were no vitals filed for this visit.   Subjective Assessment - 07/03/21 1606     Subjective No adverse reaction to manual work at last visit. Has been sitting for exams for 3 hours today and in a chair that was too short for her to reach the floor. Less burning feeling the day of treatment    Currently in Pain? Yes    Pain Score 5     Pain Location Shoulder    Pain Orientation Left    Pain Descriptors / Indicators Burning;Throbbing                               [facility] Adult PT Treatment/Exercise - 07/03/21 0001       Neuro Re-ed    Neuro Re-ed Details  working on UE movement while maintaining posterior shoulder girdle alignment/engaged      Shoulder Exercises: Standing   Retraction Strengthening;Both;10 reps;Theraband    Theraband Level (Shoulder Retraction) Level 1 (Yellow)    Other Standing Exercises axial extension 5 sec x 5 reps; scap squeeze 5 sec x 5 reps; L's x 10; W's x 10 with noodle along spine      Shoulder Exercises: ROM/Strengthening   UBE (Upper Arm Bike) L2 x 4 min 2 min fwd/2 min back    "W" Arms yellow TB x 10 reps noodle  along spine VC for thoracic and scapular posture and alignment      Shoulder Exercises: Stretch   Other Shoulder Stretches doorway stretch three positions 30 sec hold x 2 reps    Other Shoulder Stretches neural mobilization 1 min x 2 reps each UE pt supine      Manual Therapy   Joint Mobilization cervical and upper thoracic PA mobs    Soft tissue mobilization STM to pt tolerance through the anterior/lateral/posterior cervical and shoulder girdle area; pecs; anterior deltiod; teres    Passive ROM cervical flexion 10-15 sec hold x 2 reps                     PT Education - 07/03/21 1647     Education Details HEP    Person(s) Educated Patient    Methods Explanation;Demonstration;Tactile cues;Verbal cues;Handout    Comprehension Verbalized understanding;Returned demonstration;Verbal cues required;Tactile cues required                 PT Long Term Goals - 06/18/21 1703  PT LONG TERM GOAL #1   Title Improve posture and alignment with patient to demonstrate improved upright posture with posterior shoulder girdle engaged    Time 6    Period Weeks    Status New    Target Date 07/30/21      PT LONG TERM GOAL #2   Title Increase cervical ROM to [facility] in all planes with no pain or pulling    Time 6    Period Weeks    Status New    Target Date 07/30/21      PT LONG TERM GOAL #3   Title Patient reports 75% improvement in return to normal work and recreational activities    Time 6    Period Weeks    Status New    Target Date 07/30/21      PT LONG TERM GOAL #4   Title Independent in HEP    Time 6    Period Weeks    Status New    Target Date 07/30/21      PT LONG TERM GOAL #5   Title Improve functional limitation score to 69    Time 6    Period Weeks    Status New    Target Date 07/30/21                   Plan - 07/03/21 1611     Clinical Impression Statement Good response to treatment from last visit with manual work through the cervical  and shoulder girdle. Note improved tissue extensibility through pecs. Continued tightness cervical musculature especially anteriorly. Tolerated treatment well. Added resistive bands for L's and W's without difficulty.    Rehab Potential Good    PT Frequency 2x / week    PT Duration 6 weeks    PT Treatment/Interventions ADLs/Self Care Home Management;Aquatic Therapy;Cryotherapy;Electrical Stimulation;Iontophoresis 4mg /ml Dexamethasone;Moist Heat;Ultrasound;Functional mobility training;Therapeutic activities;Therapeutic exercise;Neuromuscular re-education;Patient/family education;Manual techniques;Passive range of motion;Dry needling;Taping    PT Next Visit Plan review HEP; progress with postural correction; TENS for home purchase; hold DN and/or manual work; modalities as indicated    PT Home Exercise Plan [email or web address]    Consulted and Agree with Plan of Care Patient             Patient will benefit from skilled therapeutic intervention in order to improve the following deficits and impairments:     Visit Diagnosis: Chronic left shoulder pain  Abnormal posture  Other symptoms and signs involving the musculoskeletal system     Problem List Patient Active Problem List   Diagnosis Date Noted   Trapezius muscle spasm 12/26/2020   Acute pain of left shoulder 12/26/2020   Multiple food allergies 05/04/2020   Gastroesophageal reflux disease 01/03/2020   Environmental and seasonal allergies 10/04/2018   Nausea 01/01/2018   Low energy 01/01/2018   Severe persistent asthma without complication 10/03/2016   Irregular periods/menstrual cycles 10/03/2016    Jaylan Hinojosa 12/03/2016, PT, MPH 07/03/2021, 4:57 PM  [facility] [address] [place] [address] [place], [place], [place] Phone: [phone]   Fax:  [phone]  Name: Carol [name] MRN: Carol [name] Date of Birth: 1991-05-08

## 2021-07-05 ENCOUNTER — Ambulatory Visit (INDEPENDENT_AMBULATORY_CARE_PROVIDER_SITE_OTHER): Payer: BLUE CROSS/BLUE SHIELD | Admitting: Rehabilitative and Restorative Service Providers"

## 2021-07-05 ENCOUNTER — Other Ambulatory Visit: Payer: Self-pay

## 2021-07-05 ENCOUNTER — Encounter: Payer: Self-pay | Admitting: Rehabilitative and Restorative Service Providers"

## 2021-07-05 DIAGNOSIS — M25512 Pain in left shoulder: Secondary | ICD-10-CM | POA: Diagnosis not present

## 2021-07-05 DIAGNOSIS — R29898 Other symptoms and signs involving the musculoskeletal system: Secondary | ICD-10-CM

## 2021-07-05 DIAGNOSIS — G8929 Other chronic pain: Secondary | ICD-10-CM | POA: Diagnosis not present

## 2021-07-05 DIAGNOSIS — R293 Abnormal posture: Secondary | ICD-10-CM | POA: Diagnosis not present

## 2021-07-05 NOTE — Patient Instructions (Signed)
Access Code: [email or web address] URL: [email or web address] Date: 07/05/2021 Prepared by: Corlis Leak  Exercises Seated Cervical Retraction - 3 x daily - 7 x weekly - 1 sets - 10 reps Standing Scapular Retraction - 3 x daily - 7 x weekly - 1 sets - 10 reps - 10 hold Shoulder External Rotation and Scapular Retraction - 3 x daily - 7 x weekly - 1 sets - 10 reps - hold Shoulder External Rotation in 45 Degrees Abduction - 2 x daily - 7 x weekly - 1-2 sets - 10 reps - 3 sec hold Doorway Pec Stretch at 60 Degrees Abduction - 3 x daily - 7 x weekly - 1 sets - 3 reps Doorway Pec Stretch at 90 Degrees Abduction - 3 x daily - 7 x weekly - 1 sets - 3 reps - 30 seconds hold Doorway Pec Stretch at 120 Degrees Abduction - 3 x daily - 7 x weekly - 1 sets - 3 reps - 30 second hold hold Standing Shoulder External Rotation with Resistance - 2 x daily - 7 x weekly - 1-3 sets - 10 reps - 2-3 sec hold Shoulder External Rotation in 45 Degrees Abduction - 2 x daily - 7 x weekly - 1-2 sets - 10 reps - 3 sec hold Standing Shoulder Row Reactive Isometric - 2 x daily - 7 x weekly - 1 sets - 10 reps - 3-5 sec hold Drawing Bow - 1 x daily - 7 x weekly - 1 sets - 10 reps - 3 sec hold

## 2021-07-05 NOTE — Therapy (Signed)
[facility] [place] [address] [place], [place], [address] Phone: [phone]   Fax:  [phone]  Physical Therapy Treatment  Patient Details  Name: Carol [name] MRN: [id number] Date of Birth: Jun 21, 1991 Referring Provider (PT): Dr Benjamin Stain   Encounter Date: 07/05/2021   PT End of Session - 07/05/21 1059     Visit Number 5    Number of Visits 12    Date for PT Re-Evaluation 07/30/21    PT Start Time 1059    PT Stop Time 1147    PT Time Calculation (min) 48 min    Activity Tolerance Patient tolerated treatment well             Past Medical History:  Diagnosis Date   Asthma    Heart murmur    Migraines    Seizures (HCC)     Past Surgical History:  Procedure Laterality Date   TEAR DUCT PROBING  1991   WISDOM TOOTH EXTRACTION  2016    There were no vitals filed for this visit.   Subjective Assessment - 07/05/21 1100     Subjective Less sore from the [facility] and exercises. She is doing OK with exercises.    Currently in Pain? Yes    Pain Score 3     Pain Location Shoulder    Pain Orientation Left    Pain Descriptors / Indicators Aching;Nagging    Pain Type Chronic pain                               [facility] Adult PT Treatment/Exercise - 07/05/21 0001       Shoulder Exercises: ROM/Strengthening   UBE (Upper Arm Bike) L2 x 4 min 2 min fwd/2 min back      Shoulder Exercises: Stretch   Other Shoulder Stretches doorway stretch three positions 30 sec hold x 2 reps      Manual Therapy   Joint Mobilization cervical and upper thoracic PA mobs    Soft tissue mobilization STM to pt tolerance through the anterior/lateral/posterior cervical and shoulder girdle area; pecs; anterior deltiod; teres    Scapular Mobilization Lt    Passive ROM cervical flexion 10-15 sec hold x 2 reps; thoracic/rib mobs to pt tolerance                     PT Education - 07/05/21 1124     Education  Details HEP    Person(s) Educated Patient    Methods Explanation;Demonstration;Tactile cues;Verbal cues;Handout    Comprehension Verbalized understanding;Returned demonstration;Verbal cues required;Tactile cues required                 PT Long Term Goals - 06/18/21 1703       PT LONG TERM GOAL #1   Title Improve posture and alignment with patient to demonstrate improved upright posture with posterior shoulder girdle engaged    Time 6    Period Weeks    Status New    Target Date 07/30/21      PT LONG TERM GOAL #2   Title Increase cervical ROM to [facility] in all planes with no pain or pulling    Time 6    Period Weeks    Status New    Target Date 07/30/21      PT LONG TERM GOAL #3   Title Patient reports 75% improvement in return to normal work and recreational activities  Time 6    Period Weeks    Status New    Target Date 07/30/21      PT LONG TERM GOAL #4   Title Independent in HEP    Time 6    Period Weeks    Status New    Target Date 07/30/21      PT LONG TERM GOAL #5   Title Improve functional limitation score to 69    Time 6    Period Weeks    Status New    Target Date 07/30/21                   Plan - 07/05/21 1102     Clinical Impression Statement Continued gradual improvement in Lt UE radicular symptoms. Less burning and throbbing type pain. Progressing with posterior shoulder girdle strengthening.    Rehab Potential Good    PT Frequency 2x / week    PT Duration 6 weeks    PT Treatment/Interventions ADLs/Self Care Home Management;Aquatic Therapy;Cryotherapy;Electrical Stimulation;Iontophoresis 4mg /ml Dexamethasone;Moist Heat;Ultrasound;Functional mobility training;Therapeutic activities;Therapeutic exercise;Neuromuscular re-education;Patient/family education;Manual techniques;Passive range of motion;Dry needling;Taping    PT Next Visit Plan review HEP; progress with postural correction; TENS for home purchase; hold DN and/or manual work;  modalities as indicated    PT Home Exercise Plan [email or web address]    Consulted and Agree with Plan of Care Patient             Patient will benefit from skilled therapeutic intervention in order to improve the following deficits and impairments:     Visit Diagnosis: Chronic left shoulder pain  Abnormal posture  Other symptoms and signs involving the musculoskeletal system     Problem List Patient Active Problem List   Diagnosis Date Noted   Trapezius muscle spasm 12/26/2020   Acute pain of left shoulder 12/26/2020   Multiple food allergies 05/04/2020   Gastroesophageal reflux disease 01/03/2020   Environmental and seasonal allergies 10/04/2018   Nausea 01/01/2018   Low energy 01/01/2018   Severe persistent asthma without complication 10/03/2016   Irregular periods/menstrual cycles 10/03/2016    Jaecion Dempster 12/03/2016, PT, MPH  07/05/2021, 11:53 AM  [facility] [address] [place] [address] [place], [place], [place] Phone: [phone]   Fax:  [phone]  Name: Yaresly Menzel MRN: [name] Date of Birth: 1990/11/22

## 2021-07-08 ENCOUNTER — Other Ambulatory Visit: Payer: Self-pay

## 2021-07-08 ENCOUNTER — Encounter: Payer: Self-pay | Admitting: Physical Therapy

## 2021-07-08 ENCOUNTER — Ambulatory Visit (INDEPENDENT_AMBULATORY_CARE_PROVIDER_SITE_OTHER): Payer: BLUE CROSS/BLUE SHIELD | Admitting: Physical Therapy

## 2021-07-08 DIAGNOSIS — R293 Abnormal posture: Secondary | ICD-10-CM

## 2021-07-08 DIAGNOSIS — M25512 Pain in left shoulder: Secondary | ICD-10-CM | POA: Diagnosis not present

## 2021-07-08 DIAGNOSIS — R29898 Other symptoms and signs involving the musculoskeletal system: Secondary | ICD-10-CM

## 2021-07-08 DIAGNOSIS — G8929 Other chronic pain: Secondary | ICD-10-CM

## 2021-07-08 NOTE — Therapy (Signed)
[facility] [place] [address] [place] [address] [place], [place], [address] Phone: [phone]   Fax:  [phone]  Physical Therapy Treatment  Patient Details  Name: Carol [name] MRN: [id number] Date of Birth: 1990-10-06 Referring Provider (PT): Dr Benjamin Stain   Encounter Date: 07/08/2021   PT End of Session - 07/08/21 0722     Visit Number 6    Number of Visits 12    Date for PT Re-Evaluation 07/30/21    PT Start Time 0719    PT Stop Time 0804    PT Time Calculation (min) 45 min    Activity Tolerance Patient tolerated treatment well    Behavior During Therapy [facility] for tasks assessed/performed             Past Medical History:  Diagnosis Date   Asthma    Heart murmur    Migraines    Seizures (HCC)     Past Surgical History:  Procedure Laterality Date   TEAR DUCT PROBING  1991   WISDOM TOOTH EXTRACTION  2016    There were no vitals filed for this visit.   Subjective Assessment - 07/08/21 0722     Subjective Pt reports that the exercises are going well.  Lt Upper trap pain remains constant.  Pain is not as intense as when she started PT.    Currently in Pain? Yes    Pain Score 3     Pain Location Shoulder    Pain Orientation Left    Pain Descriptors / Indicators Tightness    Aggravating Factors  compression exercises (kickboxing), Lying on Rt side.    Pain Relieving Factors heat, stretches                [facility] PT Assessment - 07/08/21 0001       Assessment   Medical Diagnosis Cervical dysfunction; Lt shoulder pain    Referring Provider (PT) Dr Benjamin Stain    Onset Date/Surgical Date 11/25/20    Hand Dominance Right;Left   writes with Rt; computer work Lt   Next MD Visit 07/19/21    Prior Therapy for knees when in HS               [facility] Adult PT Treatment/Exercise - 07/08/21 0001       Neck Exercises: Stretches   Upper Trapezius Stretch Left;3 reps;Right;2 reps;20 seconds   seated     Shoulder  Exercises: Standing   Row Strengthening;Both;10 reps   3 sec hold in retraction   Theraband Level (Shoulder Row) Level 3 (Green)    Row Limitations 1st set bow + arrow x 10, 2nd set traditional row.    Retraction Strengthening;Both;10 reps;Theraband    Theraband Level (Shoulder Retraction) Level 1 (Yellow)    Retraction Limitations mirror for visual feedback    Other Standing Exercises back against noodle with axial ext and resisted W's with yellow band x 10 (3 sec hold)      Shoulder Exercises: ROM/Strengthening   UBE (Upper Arm Bike) L2: 1.5 min each direction, in standing.      Shoulder Exercises: Stretch   Other Shoulder Stretches doorway stretch three positions 30 sec hold x 2 reps, trial of unilateral stretch x 20 sec    Other Shoulder Stretches thoracic ext over foam roller x 3 reps.  Open book x 6 reps each side.      Manual Therapy   Soft tissue mobilization STM to Rt / Lt cervical paraspinals, scalenes, upper trap, levator, suboccipitals  Passive ROM cervical rotation and lateral flexion               PT Education - 07/08/21 0825     Education Details verbally added upper trap stretch, open book.    Person(s) Educated Patient    Methods Explanation;Demonstration;Verbal cues    Comprehension Verbalized understanding;Returned demonstration                 PT Long Term Goals - 07/08/21 0827       PT LONG TERM GOAL #1   Title Improve posture and alignment with patient to demonstrate improved upright posture with posterior shoulder girdle engaged    Time 6    Period Weeks    Status On-going    Target Date 07/30/21      PT LONG TERM GOAL #2   Title Increase cervical ROM to [facility] in all planes with no pain or pulling    Time 6    Period Weeks    Status On-going    Target Date 07/30/21      PT LONG TERM GOAL #3   Title Patient reports 75% improvement in return to normal work and recreational activities    Time 6    Period Weeks    Status On-going     Target Date 07/30/21      PT LONG TERM GOAL #4   Title Independent in HEP    Time 6    Period Weeks    Status On-going    Target Date 07/30/21      PT LONG TERM GOAL #5   Title Improve functional limitation score to 69    Time 6    Period Weeks    Status On-going    Target Date 07/30/21                   Plan - 07/08/21 0823     Clinical Impression Statement Pt tolerated all exercises well, without increase in discomfort.  Minimal cues needed for good form.  Increased palpable tightness on Rt cervical paraspinals and levator vs Lt.  Pt reported elimination of pain by end of session.  Progressing well towards goals.    Rehab Potential Good    PT Frequency 2x / week    PT Duration 6 weeks    PT Treatment/Interventions ADLs/Self Care Home Management;Aquatic Therapy;Cryotherapy;Electrical Stimulation;Iontophoresis 4mg /ml Dexamethasone;Moist Heat;Ultrasound;Functional mobility training;Therapeutic activities;Therapeutic exercise;Neuromuscular re-education;Patient/family education;Manual techniques;Passive range of motion;Dry needling;Taping    PT Next Visit Plan progress with postural correction; TENS for home purchase; manual work and modalities as indicated    PT Home Exercise Plan [email or web address]    Consulted and Agree with Plan of Care Patient             Patient will benefit from skilled therapeutic intervention in order to improve the following deficits and impairments:  Decreased range of motion, Decreased activity tolerance, Pain, Hypomobility, Impaired flexibility, Improper body mechanics, Decreased mobility, Decreased strength, Postural dysfunction  Visit Diagnosis: Chronic left shoulder pain  Abnormal posture  Other symptoms and signs involving the musculoskeletal system     Problem List Patient Active Problem List   Diagnosis Date Noted   Trapezius muscle spasm 12/26/2020   Acute pain of left shoulder 12/26/2020   Multiple food allergies 05/04/2020    Gastroesophageal reflux disease 01/03/2020   Environmental and seasonal allergies 10/04/2018   Nausea 01/01/2018   Low energy 01/01/2018   Severe persistent asthma without complication 10/03/2016   Irregular periods/menstrual cycles  10/03/2016   Mayer Camel, PTA 07/08/21 8:27 AM  [facility] [address] [place] [address] [place], [place], [address] Phone: [phone]   Fax:  [phone]  Name: Carol [name] MRN: [id number] Date of Birth: 01/26/1991

## 2021-07-10 ENCOUNTER — Ambulatory Visit (INDEPENDENT_AMBULATORY_CARE_PROVIDER_SITE_OTHER): Payer: BLUE CROSS/BLUE SHIELD | Admitting: Rehabilitative and Restorative Service Providers"

## 2021-07-10 ENCOUNTER — Other Ambulatory Visit: Payer: Self-pay

## 2021-07-10 ENCOUNTER — Encounter: Payer: Self-pay | Admitting: Rehabilitative and Restorative Service Providers"

## 2021-07-10 DIAGNOSIS — G8929 Other chronic pain: Secondary | ICD-10-CM

## 2021-07-10 DIAGNOSIS — M25512 Pain in left shoulder: Secondary | ICD-10-CM

## 2021-07-10 DIAGNOSIS — R29898 Other symptoms and signs involving the musculoskeletal system: Secondary | ICD-10-CM | POA: Diagnosis not present

## 2021-07-10 DIAGNOSIS — R293 Abnormal posture: Secondary | ICD-10-CM | POA: Diagnosis not present

## 2021-07-10 NOTE — Therapy (Signed)
[facility] [place] [address] [place] [address] [place], [place], [address] Phone: [phone]   Fax:  [phone]  Physical Therapy Treatment  Patient Details  Name: Carol [name] MRN: [id number] Date of Birth: [birth year] Referring Provider (PT): Dr Benjamin Stain   Encounter Date: 07/10/2021   PT End of Session - 07/10/21 0717     Visit Number 7    Number of Visits 12    Date for PT Re-Evaluation 07/30/21    PT Start Time 0715    PT Stop Time 0800    PT Time Calculation (min) 45 min    Activity Tolerance Patient tolerated treatment well             Past Medical History:  Diagnosis Date   Asthma    Heart murmur    Migraines    Seizures (HCC)     Past Surgical History:  Procedure Laterality Date   TEAR DUCT PROBING  1991   WISDOM TOOTH EXTRACTION  2016    There were no vitals filed for this visit.   Subjective Assessment - 07/10/21 0718     Subjective Doing OK. 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 Symptoms are about same. Doing OK with HEP.    Currently in Pain? Yes    Pain Score 3     Pain Location Shoulder    Pain Orientation Left    Pain Descriptors / Indicators Tightness    Pain Type Chronic pain                               [facility] Adult PT Treatment/Exercise - 07/10/21 0001       Shoulder Exercises: Supine   Other Supine Exercises trunk rotation supine 20 sec hold x 2 each side      Shoulder Exercises: Standing   Row Strengthening;Both;10 reps   3 sec hold in retraction   Theraband Level (Shoulder Row) Level 3 (Green)    Row Limitations 1st set bow + arrow x 10, 2nd set traditional row.    Retraction Strengthening;Both;10 reps;Theraband    Theraband Level (Shoulder Retraction) Level 1 (Yellow)    Other Standing Exercises resisted W's with yellow band x 10 (3 sec hold)      Shoulder Exercises: ROM/Strengthening   UBE (Upper Arm Bike) L4: 1.5 min each direction, in standing.       Shoulder Exercises: Stretch   Other Shoulder Stretches doorway stretch three positions 30 sec hold x 2 reps, trial of unilateral stretch x 20 sec    Other Shoulder Stretches thoracic ext over foam roller x 3 reps.  Open book x 6 reps each side.      Manual Therapy   Soft tissue mobilization STM to Rt / Lt pecs, cervical paraspinals, scalenes, upper trap, levator, suboccipitals    Passive ROM cervical rotation and lateral flexion                          PT Long Term Goals - 07/08/21 0827       PT LONG TERM GOAL #1   Title Improve posture and alignment with patient to demonstrate improved upright posture with posterior shoulder girdle engaged    Time 6    Period Weeks    Status On-going    Target Date 07/30/21      PT LONG TERM GOAL #2   Title Increase cervical ROM to [facility] in  all planes with no pain or pulling    Time 6    Period Weeks    Status On-going    Target Date 07/30/21      PT LONG TERM GOAL #3   Title Patient reports 75% improvement in return to normal work and recreational activities    Time 6    Period Weeks    Status On-going    Target Date 07/30/21      PT LONG TERM GOAL #4   Title Independent in HEP    Time 6    Period Weeks    Status On-going    Target Date 07/30/21      PT LONG TERM GOAL #5   Title Improve functional limitation score to 69    Time 6    Period Weeks    Status On-going    Target Date 07/30/21                   Plan - 07/10/21 0720     Clinical Impression Statement Continued with consistent HEP. Muscular tightness through cervical spine and shoulders continue. Responds well to manual work.    Rehab Potential Good    PT Frequency 2x / week    PT Duration 6 weeks    PT Treatment/Interventions ADLs/Self Care Home Management;Aquatic Therapy;Cryotherapy;Electrical Stimulation;Iontophoresis 4mg /ml Dexamethasone;Moist Heat;Ultrasound;Functional mobility training;Therapeutic activities;Therapeutic  exercise;Neuromuscular re-education;Patient/family education;Manual techniques;Passive range of motion;Dry needling;Taping    PT Next Visit Plan progress with postural correction; manual work and modalities as indicated; now has TENS for home use    PT Home Exercise Plan [email or web address]    Consulted and Agree with Plan of Care Patient             Patient will benefit from skilled therapeutic intervention in order to improve the following deficits and impairments:     Visit Diagnosis: Chronic left shoulder pain  Abnormal posture  Other symptoms and signs involving the musculoskeletal system     Problem List Patient Active Problem List   Diagnosis Date Noted   Trapezius muscle spasm 12/26/2020   Acute pain of left shoulder 12/26/2020   Multiple food allergies 05/04/2020   Gastroesophageal reflux disease 01/03/2020   Environmental and seasonal allergies 10/04/2018   Nausea 01/01/2018   Low energy 01/01/2018   Severe persistent asthma without complication 10/03/2016   Irregular periods/menstrual cycles 10/03/2016    [name] 12/03/2016, PT, MPH  07/10/2021, 7:57 AM  [facility] [address] [place] [address] [place], [place], [place] Phone: [phone]   Fax:  [phone]  Name: Carol [name] MRN: [name] Date of Birth: 1991-05-31

## 2021-07-12 ENCOUNTER — Encounter: Payer: Self-pay | Admitting: Rehabilitative and Restorative Service Providers"

## 2021-07-12 ENCOUNTER — Other Ambulatory Visit: Payer: Self-pay

## 2021-07-12 ENCOUNTER — Ambulatory Visit (INDEPENDENT_AMBULATORY_CARE_PROVIDER_SITE_OTHER): Payer: BLUE CROSS/BLUE SHIELD | Admitting: Rehabilitative and Restorative Service Providers"

## 2021-07-12 DIAGNOSIS — R293 Abnormal posture: Secondary | ICD-10-CM | POA: Diagnosis not present

## 2021-07-12 DIAGNOSIS — R29898 Other symptoms and signs involving the musculoskeletal system: Secondary | ICD-10-CM | POA: Diagnosis not present

## 2021-07-12 DIAGNOSIS — G8929 Other chronic pain: Secondary | ICD-10-CM | POA: Diagnosis not present

## 2021-07-12 DIAGNOSIS — M25512 Pain in left shoulder: Secondary | ICD-10-CM

## 2021-07-12 NOTE — Patient Instructions (Signed)
Access Code: [email or web address] URL: [email or web address] Date: 07/12/2021 Prepared by: Corlis Leak  Exercises Seated Cervical Retraction - 3 x daily - 7 x weekly - 1 sets - 10 reps Standing Scapular Retraction - 3 x daily - 7 x weekly - 1 sets - 10 reps - 10 hold Shoulder External Rotation and Scapular Retraction - 3 x daily - 7 x weekly - 1 sets - 10 reps - hold Shoulder External Rotation in 45 Degrees Abduction - 2 x daily - 7 x weekly - 1-2 sets - 10 reps - 3 sec hold Doorway Pec Stretch at 60 Degrees Abduction - 3 x daily - 7 x weekly - 1 sets - 3 reps Doorway Pec Stretch at 90 Degrees Abduction - 3 x daily - 7 x weekly - 1 sets - 3 reps - 30 seconds hold Doorway Pec Stretch at 120 Degrees Abduction - 3 x daily - 7 x weekly - 1 sets - 3 reps - 30 second hold hold Standing Shoulder External Rotation with Resistance - 2 x daily - 7 x weekly - 1-3 sets - 10 reps - 2-3 sec hold Shoulder External Rotation in 45 Degrees Abduction - 2 x daily - 7 x weekly - 1-2 sets - 10 reps - 3 sec hold Standing Shoulder Row Reactive Isometric - 2 x daily - 7 x weekly - 1 sets - 10 reps - 3-5 sec hold Drawing Bow - 1 x daily - 7 x weekly - 1 sets - 10 reps - 3 sec hold Anti-Rotation Lateral Stepping with Press - 2 x daily - 7 x weekly - 1-2 sets - 10 reps - 2-3 sec hold

## 2021-07-12 NOTE — Therapy (Signed)
[facility] [address] [place], [place], [address] Phone: [phone]   Fax:  [phone]  Physical Therapy Treatment  Patient Details  Name: Carol [name] MRN: [id number] Date of Birth: [birth year] Referring Provider (PT): Dr Benjamin Stain   Encounter Date: 07/12/2021   PT End of Session - 07/12/21 0804     Visit Number 8    Number of Visits 12    Date for PT Re-Evaluation 07/30/21    PT Start Time 0803    PT Stop Time 0851    PT Time Calculation (min) 48 min    Activity Tolerance Patient tolerated treatment well             Past Medical History:  Diagnosis Date   Asthma    Heart murmur    Migraines    Seizures (HCC)     Past Surgical History:  Procedure Laterality Date   TEAR DUCT PROBING  1991   WISDOM TOOTH EXTRACTION  2016    There were no vitals filed for this visit.   Subjective Assessment - 07/12/21 0804     Subjective Patient reports that she is having some pain in the neck area more so than shoulder today. Grading end of semester papers and doing more at the computer. Using TENS at home.    Currently in Pain? Yes    Pain Score 3     Pain Location Neck    Pain Orientation Left    Pain Descriptors / Indicators Tightness    Pain Type Chronic pain                               [facility] Adult PT Treatment/Exercise - 07/12/21 0001       Shoulder Exercises: Standing   Row Strengthening;Both;10 reps   3 sec hold in retraction   Theraband Level (Shoulder Row) Level 4 (Blue)    Row Limitations bow and arrow green x 10 reps    Retraction Strengthening;Both;10 reps;Theraband    Theraband Level (Shoulder Retraction) Level 1 (Yellow)    Other Standing Exercises resisted W's with yellow band x 10 (3 sec hold)    Other Standing Exercises antirotation green TB x 10 each side      Shoulder Exercises: ROM/Strengthening   UBE (Upper Arm Bike) L4: 2 min each direction, in standing.     "W" Arms yellow TB x 10 reps      Shoulder Exercises: Stretch   Other Shoulder Stretches doorway stretch three positions 30 sec hold x 2 reps, trial of unilateral stretch x 20 sec      Moist Heat Therapy   Number Minutes Moist Heat 5 Minutes    Moist Heat Location Cervical;Shoulder      Manual Therapy   Joint Mobilization cervical and upper thoracic PA mobs    Soft tissue mobilization STM to Rt / Lt pecs, cervical paraspinals, scalenes, upper trap, levator, suboccipitals    Scapular Mobilization Lt/Rt    Passive ROM cervical flexion; rotation and lateral flexion                     PT Education - 07/12/21 0817     Education Details HEP    Person(s) Educated Patient    Methods Explanation;Demonstration;Tactile cues;Verbal cues;Handout    Comprehension Verbalized understanding;Returned demonstration;Verbal cues required;Tactile cues required  PT Long Term Goals - 07/08/21 0827       PT LONG TERM GOAL #1   Title Improve posture and alignment with patient to demonstrate improved upright posture with posterior shoulder girdle engaged    Time 6    Period Weeks    Status On-going    Target Date 07/30/21      PT LONG TERM GOAL #2   Title Increase cervical ROM to [facility] in all planes with no pain or pulling    Time 6    Period Weeks    Status On-going    Target Date 07/30/21      PT LONG TERM GOAL #3   Title Patient reports 75% improvement in return to normal work and recreational activities    Time 6    Period Weeks    Status On-going    Target Date 07/30/21      PT LONG TERM GOAL #4   Title Independent in HEP    Time 6    Period Weeks    Status On-going    Target Date 07/30/21      PT LONG TERM GOAL #5   Title Improve functional limitation score to 69    Time 6    Period Weeks    Status On-going    Target Date 07/30/21                   Plan - 07/12/21 0806     Clinical Impression Statement Some increased pain  related to activity. Continued palpable tightness through the cervical spine and shoulder musculature. Gradually progressing with fewer radicular symptoms.    Rehab Potential Good    PT Frequency 2x / week    PT Duration 6 weeks    PT Treatment/Interventions ADLs/Self Care Home Management;Aquatic Therapy;Cryotherapy;Electrical Stimulation;Iontophoresis 4mg /ml Dexamethasone;Moist Heat;Ultrasound;Functional mobility training;Therapeutic activities;Therapeutic exercise;Neuromuscular re-education;Patient/family education;Manual techniques;Passive range of motion;Dry needling;Taping    PT Next Visit Plan progress with postural correction; manual work and modalities as indicated    PT Home Exercise Plan [email or web address]    Consulted and Agree with Plan of Care Patient             Patient will benefit from skilled therapeutic intervention in order to improve the following deficits and impairments:     Visit Diagnosis: Chronic left shoulder pain  Abnormal posture  Other symptoms and signs involving the musculoskeletal system     Problem List Patient Active Problem List   Diagnosis Date Noted   Trapezius muscle spasm 12/26/2020   Acute pain of left shoulder 12/26/2020   Multiple food allergies 05/04/2020   Gastroesophageal reflux disease 01/03/2020   Environmental and seasonal allergies 10/04/2018   Nausea 01/01/2018   Low energy 01/01/2018   Severe persistent asthma without complication 10/03/2016   Irregular periods/menstrual cycles 10/03/2016    Carol [name] 12/03/2016, PT, MPH  07/12/2021, 8:44 AM  [facility] [address] [place], [place], [place] Phone: [phone]   Fax:  [phone]  Name: Carol [name] MRN: [name] Date of Birth: 09-13-1990

## 2021-07-16 ENCOUNTER — Other Ambulatory Visit: Payer: Self-pay

## 2021-07-16 ENCOUNTER — Ambulatory Visit (INDEPENDENT_AMBULATORY_CARE_PROVIDER_SITE_OTHER): Payer: BLUE CROSS/BLUE SHIELD | Admitting: Physical Therapy

## 2021-07-16 DIAGNOSIS — R29898 Other symptoms and signs involving the musculoskeletal system: Secondary | ICD-10-CM

## 2021-07-16 DIAGNOSIS — G8929 Other chronic pain: Secondary | ICD-10-CM

## 2021-07-16 DIAGNOSIS — R293 Abnormal posture: Secondary | ICD-10-CM

## 2021-07-16 DIAGNOSIS — M25512 Pain in left shoulder: Secondary | ICD-10-CM

## 2021-07-16 NOTE — Therapy (Signed)
[place], [place], [address] Phone: [phone]   Fax:  [phone]  Physical Therapy Treatment  Patient Details  Name: Carol [name] MRN: [id number] Date of Birth: 1990-12-14 Referring Provider (PT): Dr Dianah Field   Encounter Date: 07/16/2021   PT End of Session - 07/16/21 0945     Visit Number 9    Number of Visits 12    Date for PT Re-Evaluation 07/30/21    PT Start Time 0938    PT Stop Time 1016    PT Time Calculation (min) 38 min    Activity Tolerance Patient tolerated treatment well    Behavior During Therapy [facility] for tasks assessed/performed             Past Medical History:  Diagnosis Date   Asthma    Heart murmur    Migraines    Seizures ([place])     Past Surgical History:  Procedure Laterality Date   TEAR DUCT [place] EXTRACTION  2016    There were no vitals filed for this visit.   Subjective Assessment - 07/16/21 0942     Subjective Pt reports she is having pockets of time where she doesn't notice any pain, which is really nice.  Pain is no longer at a constant 5/10, now more like a 2-4/10.    Currently in Pain? Yes    Pain Score 2     Pain Location Neck    Pain Orientation Left    Pain Descriptors / Indicators Aching;Tightness    Aggravating Factors  lying on Rt side    Pain Relieving Factors heat, TENS, stretches.                [facility] PT Assessment - 07/16/21 0001       Assessment   Medical Diagnosis Cervical dysfunction; Lt shoulder pain    Referring Provider (PT) Dr Dianah Field    Onset Date/Surgical Date 11/25/20    Hand Dominance Right;Left   writes with Rt; computer work Lt   Next MD Visit 07/19/21    Prior Therapy for knees when in HS      AROM   Cervical Flexion 56    Cervical Extension 58    Cervical - Right Side Bend 45    Cervical - Left Side Bend 51    Cervical - Right Rotation 70    Cervical - Left Rotation  70               [facility] Adult PT Treatment/Exercise - 07/16/21 0001       Exercises   Exercises Shoulder      Neck Exercises: Stretches   Upper Trapezius Stretch Right;Left;2 reps;20 seconds      Shoulder Exercises: Supine   Other Supine Exercises hooklying with head on green pball with head press x 5 sec x 5 reps, then head press with W's, T's, I's (modified to tolerance)x 5 of each.      Shoulder Exercises: Prone   Other Prone Exercises with abdomen on green pball - 5 reps of W's and 5 reps of T's      Shoulder Exercises: ROM/Strengthening   UBE (Upper Arm Bike) L4: 1.5 min each direction, in standing.      Shoulder Exercises: Stretch   Other Shoulder Stretches doorway stretch three positions 30 sec hold x 2 reps    Other Shoulder Stretches open book x 5 reps each side.  Manual Therapy   Manual Therapy Soft tissue mobilization;Taping    Soft tissue mobilization IASTM to Rt/Lt upper trap, levator, and cervical paraspinals to reduce fascial restrictions and improve mobility.    [employer] IT sales professional I strips of sensitive skin Rock tape applied in x pattern over Lt upper trap with 50% stretch to decompress tissue and increase proprioception.  (Test strip applied to forearm prior to application at neck due to history of allergic reaction to adhesives).                PT Long Term Goals - 07/16/21 1037       PT LONG TERM GOAL #1   Title Improve posture and alignment with patient to demonstrate improved upright posture with posterior shoulder girdle engaged    Time 6    Period Weeks    Status On-going    Target Date 07/30/21      PT LONG TERM GOAL #2   Title Increase cervical ROM to [facility] in all planes with no pain or pulling    Time 6    Period Weeks    Status Partially Met    Target Date 07/30/21      PT LONG TERM GOAL #3   Title Patient reports 75% improvement in return to normal work and recreational activities    Time  6    Period Weeks    Status On-going    Target Date 07/30/21      PT LONG TERM GOAL #4   Title Independent in HEP    Time 6    Period Weeks    Status On-going    Target Date 07/30/21      PT LONG TERM GOAL #5   Title Improve functional limitation score to 69    Time 6    Period Weeks    Status On-going    Target Date 07/30/21                   Plan - 07/16/21 1001     Clinical Impression Statement Neck/shoulders tolerated prone exercises, but breathing was difficult - limited reps to 2 sets of 5. Switched from prone to hooklying with good tolerance.  Good improvement in cervical ROM; has partially met ROM goal. Trial of ktape applied to Progressing well towards LTGs.    Rehab Potential Good    PT Frequency 2x / week    PT Duration 6 weeks    PT Treatment/Interventions ADLs/Self Care Home Management;Aquatic Therapy;Cryotherapy;Electrical Stimulation;Iontophoresis 47m/ml Dexamethasone;Moist Heat;Ultrasound;Functional mobility training;Therapeutic activities;Therapeutic exercise;Neuromuscular re-education;Patient/family education;Manual techniques;Passive range of motion;Dry needling;Taping    PT Next Visit Plan progress with postural correction; manual work and modalities as indicated.  [facility] MD NOTE.    PT Home Exercise Plan [email or web address]    Consulted and Agree with Plan of Care Patient             Patient will benefit from skilled therapeutic intervention in order to improve the following deficits and impairments:  Decreased range of motion, Decreased activity tolerance, Pain, Hypomobility, Impaired flexibility, Improper body mechanics, Decreased mobility, Decreased strength, Postural dysfunction  Visit Diagnosis: Chronic left shoulder pain  Abnormal posture  Other symptoms and signs involving the musculoskeletal system     Problem List Patient Active Problem List   Diagnosis Date Noted   Trapezius muscle spasm 12/26/2020   Acute pain of left shoulder  12/26/2020   Multiple food allergies 05/04/2020  Gastroesophageal reflux disease 01/03/2020   Environmental and seasonal allergies 10/04/2018   Nausea 01/01/2018   Low energy 01/01/2018   Severe persistent asthma without complication 80/60/7895   Irregular periods/menstrual cycles 10/03/2016   Kerin Perna, PTA 07/16/21 10:46 AM   [facility] [place], [place], [address] Phone: [phone]   Fax:  [phone]  Name: Carol [name] MRN: [id number] Date of Birth: 12-Jan-1991

## 2021-07-18 ENCOUNTER — Other Ambulatory Visit: Payer: Self-pay

## 2021-07-18 ENCOUNTER — Encounter: Payer: Self-pay | Admitting: Physician Assistant

## 2021-07-18 ENCOUNTER — Ambulatory Visit (INDEPENDENT_AMBULATORY_CARE_PROVIDER_SITE_OTHER): Payer: BLUE CROSS/BLUE SHIELD | Admitting: Rehabilitative and Restorative Service Providers"

## 2021-07-18 ENCOUNTER — Encounter: Payer: Self-pay | Admitting: Rehabilitative and Restorative Service Providers"

## 2021-07-18 DIAGNOSIS — R293 Abnormal posture: Secondary | ICD-10-CM

## 2021-07-18 DIAGNOSIS — R29898 Other symptoms and signs involving the musculoskeletal system: Secondary | ICD-10-CM | POA: Diagnosis not present

## 2021-07-18 DIAGNOSIS — G8929 Other chronic pain: Secondary | ICD-10-CM | POA: Diagnosis not present

## 2021-07-18 DIAGNOSIS — M25512 Pain in left shoulder: Secondary | ICD-10-CM | POA: Diagnosis not present

## 2021-07-18 NOTE — Patient Instructions (Addendum)
Reach for the floor   Standing with chest up, shoulders down and back Reach for the floor  Hold 10-15 sec 3-5 reps  Can tip ear toward shoulder to increase the stretch   Access Code: [email or web address] URL: [email or web address] Date: 07/18/2021 Prepared by: Corlis Leak  Exercises Seated Cervical Retraction - 3 x daily - 7 x weekly - 1 sets - 10 reps Standing Scapular Retraction - 3 x daily - 7 x weekly - 1 sets - 10 reps - 10 hold Shoulder External Rotation and Scapular Retraction - 3 x daily - 7 x weekly - 1 sets - 10 reps - hold Shoulder External Rotation in 45 Degrees Abduction - 2 x daily - 7 x weekly - 1-2 sets - 10 reps - 3 sec hold Doorway Pec Stretch at 60 Degrees Abduction - 3 x daily - 7 x weekly - 1 sets - 3 reps Doorway Pec Stretch at 90 Degrees Abduction - 3 x daily - 7 x weekly - 1 sets - 3 reps - 30 seconds hold Doorway Pec Stretch at 120 Degrees Abduction - 3 x daily - 7 x weekly - 1 sets - 3 reps - 30 second hold hold Standing Shoulder External Rotation with Resistance - 2 x daily - 7 x weekly - 1-3 sets - 10 reps - 2-3 sec hold Shoulder External Rotation in 45 Degrees Abduction - 2 x daily - 7 x weekly - 1-2 sets - 10 reps - 3 sec hold Standing Shoulder Row Reactive Isometric - 2 x daily - 7 x weekly - 1 sets - 10 reps - 3-5 sec hold Drawing Bow - 1 x daily - 7 x weekly - 1 sets - 10 reps - 3 sec hold Anti-Rotation Lateral Stepping with Press - 2 x daily - 7 x weekly - 1-2 sets - 10 reps - 2-3 sec hold Standing Shoulder Flexion Wall Walk - 2 x daily - 7 x weekly - 1 sets - 3 reps - 30 sec hold

## 2021-07-18 NOTE — Therapy (Signed)
[place], [place], [address] Phone: [phone]   Fax:  [phone]  Physical Therapy Treatment  Patient Details  Name: Carol [name] MRN: [id number] Date of Birth: 11/05/1990 Referring Provider (PT): Dr Dianah Field   Encounter Date: 07/18/2021   PT End of Session - 07/18/21 1401     Visit Number 10    Number of Visits 12    Date for PT Re-Evaluation 07/30/21    PT Start Time 4917    PT Stop Time 9150    PT Time Calculation (min) 48 min    Activity Tolerance Patient tolerated treatment well             Past Medical History:  Diagnosis Date   Asthma    Heart murmur    Migraines    Seizures ([place])     Past Surgical History:  Procedure Laterality Date   TEAR DUCT [place] EXTRACTION  2016    There were no vitals filed for this visit.   Subjective Assessment - 07/18/21 1402     Subjective Patient reports that symptoms in Lt neck, shoulder and arm have improved by ~ 80% overall. No longer having constant pain of radicular symptoms.    Currently in Pain? Yes    Pain Score 2     Pain Location Shoulder    Pain Orientation Left    Pain Descriptors / Indicators Tightness    Pain Type Chronic pain    Pain Radiating Towards occasionally pain and tightness in the neck    Pain Onset More than a month ago    Pain Frequency Intermittent    Aggravating Factors  lying on Rt side    Pain Relieving Factors heat; TENS; stretches; manual work                [facility] PT Assessment - 07/18/21 0001       Assessment   Medical Diagnosis Cervical dysfunction; Lt shoulder pain    Referring Provider (PT) Dr Dianah Field    Onset Date/Surgical Date 11/25/20    Hand Dominance Right;Left   writes with Rt; computer work Lt   Next MD Visit 07/19/21    Prior Therapy for knees when in HS      Observation/Other Assessments   Focus on Therapeutic Outcomes (FOTO)  65       AROM   Cervical Flexion 56    Cervical Extension 58    Cervical - Right Side Bend 45    Cervical - Left Side Bend 51    Cervical - Right Rotation 70    Cervical - Left Rotation 70      Strength   Overall Strength Comments improving postural strength      Palpation   Spinal mobility hypomobile thorugh the mid to upper thoracic and cervical spine with PA and Lt to Rt lateral mobs    Palpation comment improving muscular tightness through the Lt > Rt pecs; upper trap; leveator; ant/lat/post cervical musculature; thoracic paraspinals                           [facility] Adult PT Treatment/Exercise - 07/18/21 0001       Neck Exercises: Stretches   Upper Trapezius Stretch Right;Left;2 reps;20 seconds    Upper Trapezius Stretch Limitations reach for the floor to stretch upper trap 10-15 sec hold x 3 reps    Other Neck Stretches  shoulder flexion stepping under dowel/ball 20-30 sec hold x 4 reps      Shoulder Exercises: Standing   Row Strengthening;Both;10 reps    Theraband Level (Shoulder Row) Level 4 (Blue)    Row Limitations bow and arrow green x 10 reps    Retraction Strengthening;Both;10 reps;Theraband    Theraband Level (Shoulder Retraction) Level 2 (Red)    Other Standing Exercises resisted W's with yellow band x 10 (3 sec hold)    Other Standing Exercises antirotation green TB x 10 each side      Shoulder Exercises: ROM/Strengthening   UBE (Upper Arm Bike) L4: 1.5 min each direction, in standing.    "W" Arms yellow TB x 10 reps      Shoulder Exercises: Stretch   Other Shoulder Stretches doorway stretch three positions 30 sec hold x 2 reps      Manual Therapy   Joint Mobilization cervical and upper thoracic PA mobs    Soft tissue mobilization deep tissue work through the ant/lat/post cervical musculature; upper traps; leveator; pecs    Passive ROM cervical flexion; rotation and lateral flexion                     PT Education - 07/18/21 1415      Education Details HEP    Person(s) Educated Patient    Methods Explanation;Demonstration;Tactile cues;Verbal cues;Handout    Comprehension Verbalized understanding;Returned demonstration;Verbal cues required;Tactile cues required                 PT Long Term Goals - 07/18/21 1446       PT LONG TERM GOAL #1   Title Improve posture and alignment with patient to demonstrate improved upright posture with posterior shoulder girdle engaged    Time 6    Period Weeks    Status On-going    Target Date 07/30/21      PT LONG TERM GOAL #2   Title Increase cervical ROM to [facility] in all planes with no pain or pulling    Time 6    Period Weeks    Status Partially Met    Target Date 07/30/21      PT LONG TERM GOAL #3   Title Patient reports 75% improvement in return to normal work and recreational activities    Time 6    Period Weeks    Status On-going    Target Date 07/30/21      PT LONG TERM GOAL #4   Title Independent in HEP    Time 6    Period Weeks    Status On-going    Target Date 07/30/21      PT LONG TERM GOAL #5   Title Improve functional limitation score to 69    Time 6    Period Weeks    Status On-going    Target Date 07/30/21                   Plan - 07/18/21 1421     Clinical Impression Statement Progressing well with decreased pain and radicular symptoms; decreased palpable tightness; increased cervical ROM; improved posture and alignment. Patient is progressing well toward stated goas of therapy. She will benefit from continued treatment to achieve maximum rehab potential.    Rehab Potential Good    PT Frequency 2x / week    PT Duration 6 weeks    PT Treatment/Interventions ADLs/Self Care Home Management;Aquatic Therapy;Cryotherapy;Electrical Stimulation;Iontophoresis 40m/ml Dexamethasone;Moist Heat;Ultrasound;Functional mobility training;Therapeutic activities;Therapeutic exercise;Neuromuscular re-education;Patient/family  education;Manual  techniques;Passive range of motion;Dry needling;Taping    PT Next Visit Plan progress with postural correction; manual work and modalities as indicated.    PT Home Exercise Plan [place] and Agree with Plan of Care Patient             Patient will benefit from skilled therapeutic intervention in order to improve the following deficits and impairments:     Visit Diagnosis: Chronic left shoulder pain  Abnormal posture  Other symptoms and signs involving the musculoskeletal system     Problem List Patient Active Problem List   Diagnosis Date Noted   Trapezius muscle spasm 12/26/2020   Acute pain of left shoulder 12/26/2020   Multiple food allergies 05/04/2020   Gastroesophageal reflux disease 01/03/2020   Environmental and seasonal allergies 10/04/2018   Nausea 01/01/2018   Low energy 01/01/2018   Severe persistent asthma without complication 93/81/0175   Irregular periods/menstrual cycles 10/03/2016    Soul Deveney Nilda Simmer, PT, MPH  07/18/2021, 2:48 PM  [facility] [place], [place], [address] Phone: [phone]   Fax:  [phone]  Name: Patria Warzecha MRN: [id number] Date of Birth: [birth year]

## 2021-07-19 ENCOUNTER — Ambulatory Visit: Payer: BLUE CROSS/BLUE SHIELD | Admitting: Sports Medicine

## 2021-07-31 ENCOUNTER — Ambulatory Visit: Payer: 59 | Attending: Sports Medicine | Admitting: Physical Therapy

## 2021-07-31 ENCOUNTER — Other Ambulatory Visit: Payer: Self-pay

## 2021-07-31 DIAGNOSIS — M25512 Pain in left shoulder: Secondary | ICD-10-CM | POA: Insufficient documentation

## 2021-07-31 DIAGNOSIS — R293 Abnormal posture: Secondary | ICD-10-CM

## 2021-07-31 DIAGNOSIS — R29898 Other symptoms and signs involving the musculoskeletal system: Secondary | ICD-10-CM

## 2021-07-31 DIAGNOSIS — G8929 Other chronic pain: Secondary | ICD-10-CM

## 2021-07-31 NOTE — Therapy (Addendum)
[place], [place], [address] Phone: [phone]   Fax:  [phone]  Physical Therapy Treatment and Discharge Summary  PHYSICAL THERAPY DISCHARGE SUMMARY  Visits from Start of Care: 11  Current functional level related to goals / functional outcomes: See progress note for discharge status   Remaining deficits: Unknown    Education / Equipment: HEP  Patient agrees to discharge. Patient goals were partially met. Patient is being discharged due to not returning since the last visit.  Celyn P. Helene Kelp PT, MPH 08/30/21 2:29 PM   Patient Details  Name: Carol [name] MRN: [id number] Date of Birth: 15-Aug-1990 Referring Provider (PT): Dr Dianah Field   Encounter Date: 07/31/2021   PT End of Session - 07/31/21 1012     Visit Number 11    Number of Visits 12    PT Start Time 9798    PT Stop Time 1016    PT Time Calculation (min) 43 min    Activity Tolerance Patient tolerated treatment well             Past Medical History:  Diagnosis Date   Asthma    Heart murmur    Migraines    Seizures ([place])     Past Surgical History:  Procedure Laterality Date   TEAR DUCT Violet EXTRACTION  2016    There were no vitals filed for this visit.   Subjective Assessment - 07/31/21 0941     Subjective Pt reports that her pain ramped up over her holiday break from school -pain hovering around 6/10.  She attributes it to increasing her kickboxing and embroidery. She did some kickboxing this morning, but avoided the painful maneuvers, "and did ok".    Currently in Pain? Yes    Pain Score 3     Pain Location Shoulder    Pain Orientation Left    Pain Descriptors / Indicators Tightness    Aggravating Factors  impingment position with kickboxing    Pain Relieving Factors heat, TENS, stretches, manual work.                [facility] PT Assessment - 07/31/21 0001        Assessment   Medical Diagnosis Cervical dysfunction; Lt shoulder pain    Referring Provider (PT) Dr Dianah Field    Onset Date/Surgical Date 11/25/20    Hand Dominance Right;Left   writes with Rt; computer work Lt   Prior Therapy for knees when in HS      Observation/Other Assessments   Focus on Therapeutic Outcomes (FOTO)  67 functional status;  goal of 69      AROM   Cervical Flexion 58    Cervical Extension 62    Cervical - Right Side Bend 58    Cervical - Left Side Bend 60    Cervical - Right Rotation 58    Cervical - Left Rotation 62              [facility] Adult PT Treatment/Exercise - 07/31/21 0001       Neck Exercises: Stretches   Upper Trapezius Stretch Right;Left;1 rep;10 seconds      Shoulder Exercises: Supine   Horizontal ABduction 5 reps;Both    Theraband Level (Shoulder Horizontal ABduction) Level 3 (Green)    Diagonals Strengthening;Right;Left;10 reps;Theraband    Theraband Level (Shoulder Diagonals) Level 3 (Green)      Shoulder Exercises: Prone   Extension 5 reps   with  axial ext   Other Prone Exercises W's, T's, Y's  with axial ext x 3 sec x 5 reps      Shoulder Exercises: Sidelying   Other Sidelying Exercises open book x 5 reps each side with green band      Shoulder Exercises: Standing   Other Standing Exercises wall angel x 10, range to tolerance.      Shoulder Exercises: ROM/Strengthening   UBE (Upper Arm Bike) L4: 1.5 min each direction, in standing.    Nustep L4: arms only x 2 min (limited tolerane)      Shoulder Exercises: Stretch   Other Shoulder Stretches doorway stretch three positions 30 sec hold x 2 reps; trial of unilateral for middle and high.      Manual Therapy   Soft tissue mobilization IASTM to Rt/Lt upper trap, levator, and cervical paraspinals to reduce fascial restrictions and improve mobility.      Kinesiotix   Create Space I strips of sensitive skin Rock tape applied in x pattern over Lt upper trap with 50% stretch to  decompress tissue and increase proprioception.                          PT Long Term Goals - 07/31/21 1313       PT LONG TERM GOAL #1   Title Improve posture and alignment with patient to demonstrate improved upright posture with posterior shoulder girdle engaged    Time 6    Period Weeks    Status Achieved    Target Date 07/30/21      PT LONG TERM GOAL #2   Title Increase cervical ROM to [facility] in all planes with no pain or pulling    Time 6    Period Weeks    Status Partially Met    Target Date 07/30/21      PT LONG TERM GOAL #3   Title Patient reports 75% improvement in return to normal work and recreational activities    Time 6    Period Weeks    Status Achieved    Target Date 07/30/21      PT LONG TERM GOAL #4   Title Independent in HEP    Time 6    Period Weeks    Status Achieved    Target Date 07/30/21      PT LONG TERM GOAL #5   Title Improve functional limitation score to 69    Time 6    Period Weeks    Status Partially Met    Target Date 07/30/21                   Plan - 07/31/21 1310     Clinical Impression Statement Improved cervical ROM; now feels a pull but no increased pain with movement.  Discussed modifications of LUE with kickboxing and embroidery.  FOTO score is unchanged from last assessment; 67.  Pt reported reduction of tightness in upper trap/neck with exercises and IASTM.  She is pleased with her level of function and requests to hold therapy while she continues working on [employer].  Will call if flare up or in need of progression of exercises.    Rehab Potential Good    PT Frequency 2x / week    PT Duration 6 weeks    PT Treatment/Interventions ADLs/Self Care Home Management;Aquatic Therapy;Cryotherapy;Electrical Stimulation;Iontophoresis 38m/ml Dexamethasone;Moist Heat;Ultrasound;Functional mobility training;Therapeutic activities;Therapeutic exercise;Neuromuscular re-education;Patient/family education;Manual  techniques;Passive range of motion;Dry needling;Taping  PT Next Visit Plan will hold therapy until 08/27/21; if pt doesn't return prior to then will d/c.    PT Home Exercise Plan [place] and Agree with Plan of Care Patient             Patient will benefit from skilled therapeutic intervention in order to improve the following deficits and impairments:  Decreased range of motion, Decreased activity tolerance, Pain, Hypomobility, Impaired flexibility, Improper body mechanics, Decreased mobility, Decreased strength, Postural dysfunction  Visit Diagnosis: Chronic left shoulder pain  Abnormal posture  Other symptoms and signs involving the musculoskeletal system     Problem List Patient Active Problem List   Diagnosis Date Noted   Trapezius muscle spasm 12/26/2020   Acute pain of left shoulder 12/26/2020   Multiple food allergies 05/04/2020   Gastroesophageal reflux disease 01/03/2020   Environmental and seasonal allergies 10/04/2018   Nausea 01/01/2018   Low energy 01/01/2018   Severe persistent asthma without complication 41/28/2081   Irregular periods/menstrual cycles 10/03/2016   Kerin Perna, PTA 07/31/21 1:14 PM   [facility] [place], [place], [address] Phone: [phone]   Fax:  [phone]  Name: Carol [name] MRN: [id number] Date of Birth: Dec 23, 1990

## 2021-08-02 ENCOUNTER — Encounter: Payer: BLUE CROSS/BLUE SHIELD | Admitting: Rehabilitative and Restorative Service Providers"

## 2021-08-14 ENCOUNTER — Telehealth: Payer: 59 | Admitting: Physician Assistant

## 2021-08-14 VITALS — Temp 98.4°F | Ht 63.0 in | Wt 167.0 lb

## 2021-08-14 DIAGNOSIS — R4589 Other symptoms and signs involving emotional state: Secondary | ICD-10-CM

## 2021-08-14 DIAGNOSIS — Z131 Encounter for screening for diabetes mellitus: Secondary | ICD-10-CM

## 2021-08-14 DIAGNOSIS — Z1329 Encounter for screening for other suspected endocrine disorder: Secondary | ICD-10-CM

## 2021-08-14 DIAGNOSIS — J4551 Severe persistent asthma with (acute) exacerbation: Secondary | ICD-10-CM

## 2021-08-14 DIAGNOSIS — Z13220 Encounter for screening for lipoid disorders: Secondary | ICD-10-CM | POA: Diagnosis not present

## 2021-08-14 DIAGNOSIS — J3089 Other allergic rhinitis: Secondary | ICD-10-CM

## 2021-08-14 DIAGNOSIS — Z79899 Other long term (current) drug therapy: Secondary | ICD-10-CM

## 2021-08-14 MED ORDER — MONTELUKAST SODIUM 10 MG PO TABS: 10.0000 mg | ORAL_TABLET | Freq: Every day | ORAL | 3 refills | Status: DC

## 2021-08-14 MED ORDER — IPRATROPIUM-ALBUTEROL 0.5-2.5 (3) MG/3ML IN SOLN: 3.0000 mL | RESPIRATORY_TRACT | 1 refills | Status: DC | PRN

## 2021-08-14 NOTE — Progress Notes (Deleted)
Dexilant working, but now having side effects She read that this can cause B12 deficiency if taken too long  Having: Headaches Hair turning white   She did start B12 supplement 2 days ago  Depression/mood Vitamin

## 2021-08-16 ENCOUNTER — Encounter: Payer: Self-pay | Admitting: Physician Assistant

## 2021-08-16 DIAGNOSIS — R4589 Other symptoms and signs involving emotional state: Secondary | ICD-10-CM | POA: Insufficient documentation

## 2021-08-16 NOTE — Progress Notes (Signed)
..Virtual Visit via Video Note  I connected with Carol Hua on 08/14/2021 at  8:10 AM EST by a video enabled telemedicine application and verified that I am speaking with the correct person using two identifiers.  Location: Patient: work Provider: clinic  .Marland KitchenParticipating in visit:  Patient: Carol [name] Provider: Tandy Gaw PA-c Provider in training: Conni Slipper PA-S   I discussed the limitations of evaluation and management by telemedicine and the availability of in person appointments. The patient expressed understanding and agreed to proceed.  History of Present Illness: Pt is a 31 yo female who needs refills and follow up.   She is doing really well. She has not had to have prednisone for asthma since last April. Dexilant is working but concern being on it too long may cause b12 to decrease. She wants checked. Having more headaches. She wants to start supplement.   Depressed mood from time to time. Overall good. No SI/HC.   Active Ambulatory Problems    Diagnosis Date Noted   Severe persistent asthma without complication 10/03/2016   Irregular periods/menstrual cycles 10/03/2016   Nausea 01/01/2018   Low energy 01/01/2018   Environmental and seasonal allergies 10/04/2018   Gastroesophageal reflux disease 01/03/2020   Multiple food allergies 05/04/2020   Trapezius muscle spasm 12/26/2020   Acute pain of left shoulder 12/26/2020   Depressed mood 08/16/2021   Resolved Ambulatory Problems    Diagnosis Date Noted   No Resolved Ambulatory Problems   Past Medical History:  Diagnosis Date   Asthma    Heart murmur    Migraines    Seizures ([facility])     Observations/Objective: No acute distress Normal mood and appearance Normal breathing  .Marland Kitchen Today's Vitals   08/14/21 0755  Temp: 98.4 F (36.9 C)  TempSrc: Oral  Weight: 167 lb (75.8 kg)  Height: 5\' 3"  (1.6 m)   Body mass index is 29.58 kg/m.  .. Depression screen [facility] 2/9 08/14/2021 01/03/2020 12/30/2017 04/07/2017   Decreased Interest 0 0 0 0  Down, Depressed, Hopeless 0 0 0 0  PHQ - 2 Score 0 0 0 0  Altered sleeping - 1 0 -  Tired, decreased energy - 1 0 -  Change in appetite - 0 0 -  Feeling bad or failure about yourself  - 0 0 -  Trouble concentrating - 0 0 -  Moving slowly or fidgety/restless - 0 0 -  Suicidal thoughts - 0 0 -  PHQ-9 Score - 2 0 -  Difficult doing work/chores - Not difficult at all Not difficult at all -      Assessment and Plan: 11/11/2018Marland KitchenDiagnoses and all orders for this visit:  Severe persistent asthma with acute exacerbation -     montelukast (SINGULAIR) 10 MG tablet; Take 1 tablet (10 mg total) by mouth at bedtime. -     ipratropium-albuterol (DUONEB) 0.5-2.5 (3) MG/3ML SOLN; Take 3 mLs by nebulization every 4 (four) hours as needed.  Screening for lipid disorders -     Lipid Panel w/reflex Direct LDL  Screening for diabetes mellitus -     COMPLETE METABOLIC PANEL WITH GFR  Thyroid disorder screen -     TSH  Medication management -     TSH -     Lipid Panel w/reflex Direct LDL -     COMPLETE METABOLIC PANEL WITH GFR -     CBC with Differential/Platelet -     B12 and Folate Panel -     VITAMIN D 25 Hydroxy (Vit-D  Deficiency, Fractures)  Environmental and seasonal allergies -     montelukast (SINGULAIR) 10 MG tablet; Take 1 tablet (10 mg total) by mouth at bedtime.  Depressed mood   I suggested staying on dexilant at least for another 6 months since lungs have been so good.  Labs to be checked. Ordered today. Refilled singulair and duoneb.  PHQ looks great today. Discussed natural ways to treat mood. Consider counseling as well.    Follow Up Instructions:    I discussed the assessment and treatment plan with the patient. The patient was provided an opportunity to ask questions and all were answered. The patient agreed with the plan and demonstrated an understanding of the instructions.   The patient was advised to call back or seek an in-person  evaluation if the symptoms worsen or if the condition fails to improve as anticipated.    Tandy Gaw, PA-C

## 2021-08-17 LAB — COMPLETE METABOLIC PANEL WITH GFR
AG Ratio: 1.6 (calc) (ref 1.0–2.5)
ALT: 16 U/L (ref 6–29)
AST: 14 U/L (ref 10–30)
Albumin: 4.2 g/dL (ref 3.6–5.1)
Alkaline phosphatase (APISO): 64 U/L (ref 31–125)
BUN: 9 mg/dL (ref 7–25)
CO2: 30 mmol/L (ref 20–32)
Calcium: 9.4 mg/dL (ref 8.6–10.2)
Chloride: 104 mmol/L (ref 98–110)
Creat: 0.69 mg/dL (ref 0.50–0.97)
Globulin: 2.6 g/dL (calc) (ref 1.9–3.7)
Glucose, Bld: 92 mg/dL (ref 65–99)
Potassium: 3.9 mmol/L (ref 3.5–5.3)
Sodium: 140 mmol/L (ref 135–146)
Total Bilirubin: 0.6 mg/dL (ref 0.2–1.2)
Total Protein: 6.8 g/dL (ref 6.1–8.1)
eGFR: 118 mL/min/{1.73_m2} (ref >=60)

## 2021-08-17 LAB — CBC WITH DIFFERENTIAL/PLATELET
Absolute Monocytes: 395 cells/uL (ref 200–950)
Basophils Absolute: 31 cells/uL (ref 0–200)
Basophils Relative: 0.6 %
Eosinophils Absolute: 99 cells/uL (ref 15–500)
Eosinophils Relative: 1.9 %
HCT: 39.3 % (ref 35.0–45.0)
Hemoglobin: 13.4 g/dL (ref 11.7–15.5)
Lymphs Abs: 1716 cells/uL (ref 850–3900)
MCH: 30.9 pg (ref 27.0–33.0)
MCHC: 34.1 g/dL (ref 32.0–36.0)
MCV: 90.8 fL (ref 80.0–100.0)
MPV: 9.5 fL (ref 7.5–12.5)
Monocytes Relative: 7.6 %
Neutro Abs: 2959 cells/uL (ref 1500–7800)
Neutrophils Relative %: 56.9 %
Platelets: 336 10*3/uL (ref 140–400)
RBC: 4.33 10*6/uL (ref 3.80–5.10)
RDW: 11.5 % (ref 11.0–15.0)
Total Lymphocyte: 33 %
WBC: 5.2 10*3/uL (ref 3.8–10.8)

## 2021-08-17 LAB — LIPID PANEL W/REFLEX DIRECT LDL
Cholesterol: 176 mg/dL (ref <200)
HDL: 52 mg/dL (ref >=50)
LDL Cholesterol (Calc): 103 mg/dL (calc) — ABNORMAL HIGH
Non-HDL Cholesterol (Calc): 124 mg/dL (calc) (ref <130)
Total CHOL/HDL Ratio: 3.4 (calc) (ref <5.0)
Triglycerides: 109 mg/dL (ref <150)

## 2021-08-17 LAB — VITAMIN D 25 HYDROXY (VIT D DEFICIENCY, FRACTURES): Vit D, 25-Hydroxy: 39 ng/mL (ref 30–100)

## 2021-08-17 LAB — TSH: TSH: 1.4 mIU/L

## 2021-08-17 LAB — B12 AND FOLATE PANEL
Folate: 16.6 ng/mL
Vitamin B-12: 369 pg/mL (ref 200–1100)

## 2021-08-19 NOTE — Progress Notes (Signed)
Carol [name],   LdL much improved and almost to optimal level.  HDL looks great.  Thyroid looks great.  Kidney, liver, glucose look good.  Hemoglobin, WBC look great.  Vitamin D good.  B12 normal range but under 400 so on the low side of normal. Please start of b12 daily.

## 2021-09-04 ENCOUNTER — Telehealth: Payer: Self-pay | Admitting: Internal Medicine

## 2021-09-27 ENCOUNTER — Telehealth: Payer: Self-pay

## 2021-09-27 ENCOUNTER — Other Ambulatory Visit: Payer: Self-pay | Admitting: Physician Assistant

## 2021-09-27 ENCOUNTER — Other Ambulatory Visit (HOSPITAL_COMMUNITY): Payer: Self-pay

## 2021-09-27 ENCOUNTER — Encounter: Payer: Self-pay | Admitting: Physician Assistant

## 2021-09-27 ENCOUNTER — Encounter: Payer: Self-pay | Admitting: Internal Medicine

## 2021-09-27 DIAGNOSIS — K219 Gastro-esophageal reflux disease without esophagitis: Secondary | ICD-10-CM

## 2021-09-27 MED ORDER — DEXLANSOPRAZOLE 30 MG PO CPDR: 30.0000 mg | DELAYED_RELEASE_CAPSULE | Freq: Every day | ORAL | 3 refills | Status: DC

## 2021-09-27 MED ORDER — ESOMEPRAZOLE MAGNESIUM 40 MG PO CPDR: DELAYED_RELEASE_CAPSULE | ORAL | 3 refills | Status: DC

## 2021-09-27 NOTE — Telephone Encounter (Signed)
Received the following messages from patient:  ? ?"Good morning!  ?  ?My new insurance [employer] (histocompatibility and immunogenetics)) will not cover the dexilant. They didn't let me know until I called them to see why my medication delivery was delayed, so I am now completely out.  ?  ?Would you be able to recommend a next step? Is there another option for me in the short term, and do we know if it is possible to have insurance cover the medication? My lungs have been doing so much better since I started Dexilant.  ?  ?Thank you!  ?  ?Best,  ?  ?Carol [name]" ? ?"Would you be able to recommend a next step? Is there another option for me in the short term, and do we know if it is possible to have insurance cover the medication? My lungs have been doing so much better since I started Dexilant." ? ?PA Team, can we start a PA on the [place] for the patient? Thank you!  ?

## 2021-09-27 NOTE — Telephone Encounter (Addendum)
Initiated Prior authorization for: ?Via: Covermymeds ?Case/Key:[id number] ?Status: denied as of 09/27/2021 ?Reason:plan exclusion ?Notified Pt via: Mychart ?

## 2021-09-27 NOTE — Telephone Encounter (Signed)
Needs PA for Dexilant ?

## 2021-09-27 NOTE — Telephone Encounter (Signed)
Received a fax regarding Prior Authorization from PATIENT for DEXILANT. Authorization has been DENIED because PT HAS NOT PROVEN THAT THEY CAN NOT TAKE THE REQUIRED NUMBER OF FORMULARY ALTERNATIVES. FORMULARY ALTERNATIVES: ESOMEPRAZOLE, LANSOPRAZOLE, OMEPRAZOLE, PANTOPRAZOLE. MUST TRY THREE (3) IN A CLASS OF THREE OR MORE ALTERNATIVES ?

## 2021-09-27 NOTE — Telephone Encounter (Signed)
Patient Advocate Encounter ?  ?Received notification from patient calls that prior authorization for Dexilant 30mg  caps is required by his/her insurance [employer]. ?  ?PA submitted on 09/27/21 ? ?Key#: [id number]  ? ?Status is pending ?   ?[facility] will continue to follow: ? ?Patient Advocate ?Fax: [phone]  ?

## 2021-09-30 NOTE — Telephone Encounter (Signed)
Ok so she does not need anything sent to pharmacy?

## 2021-09-30 NOTE — Telephone Encounter (Signed)
She has tried many GERD medications. Will you can and ask her which one she would like to try?

## 2021-10-16 ENCOUNTER — Encounter: Payer: Self-pay | Admitting: Physician Assistant

## 2021-10-18 ENCOUNTER — Telehealth (INDEPENDENT_AMBULATORY_CARE_PROVIDER_SITE_OTHER): Payer: 59 | Admitting: Physician Assistant

## 2021-10-18 ENCOUNTER — Encounter: Payer: Self-pay | Admitting: Physician Assistant

## 2021-10-18 VITALS — Temp 98.7°F | Ht 63.0 in | Wt 152.0 lb

## 2021-10-18 DIAGNOSIS — J4551 Severe persistent asthma with (acute) exacerbation: Secondary | ICD-10-CM | POA: Diagnosis not present

## 2021-10-18 DIAGNOSIS — J4550 Severe persistent asthma, uncomplicated: Secondary | ICD-10-CM | POA: Diagnosis not present

## 2021-10-18 MED ORDER — PREDNISONE 10 MG PO TABS: ORAL_TABLET | ORAL | 0 refills | Status: DC

## 2021-10-18 MED ORDER — CYCLOBENZAPRINE HCL 10 MG PO TABS: 10.0000 mg | ORAL_TABLET | Freq: Three times a day (TID) | ORAL | 0 refills | Status: AC | PRN
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
## 2021-10-18 NOTE — Progress Notes (Signed)
Started 1 week ago ?SOB, cough, post nasal drip (no congestion) ? ?On 4 different allergy meds, mucinex, Duoneb twice daily - no relief ? ? ?

## 2021-11-05 NOTE — Telephone Encounter (Signed)
Noted - closing encounter.

## 2021-11-18 ENCOUNTER — Ambulatory Visit: Payer: 59 | Admitting: Physician Assistant

## 2021-11-25 ENCOUNTER — Encounter: Payer: Self-pay | Admitting: Physician Assistant

## 2021-11-25 ENCOUNTER — Ambulatory Visit: Payer: 59 | Admitting: Physician Assistant

## 2021-11-25 VITALS — BP 130/73 | HR 71 | Ht 63.0 in | Wt 162.0 lb

## 2021-11-25 DIAGNOSIS — M25532 Pain in left wrist: Secondary | ICD-10-CM | POA: Diagnosis not present

## 2021-11-25 MED ORDER — DICLOFENAC SODIUM 1 % EX GEL: 4.0000 g | Freq: Four times a day (QID) | CUTANEOUS | 1 refills | Status: DC

## 2021-11-25 NOTE — Progress Notes (Signed)
? ?  Established Patient Office Visit ? ?Subjective   ?Patient ID: Carol [name], female    DOB: 01-26-1991  Age: 31 y.o. MRN: [id number] ? ?Chief Complaint  ?Patient presents with  ? Arm Pain  ?   ?  ? ? ?HPI ?Pt is a 31 yo female who presents to the clinic with left wrist and forearm pain for the last 8 weeks. Denies any numbness or tingling. Denies any injury or trauma. She has noticed that wrist hurts with extension and becoming weaker. She has dropped some cups and plates when she tries to use her left wrist. If she uses her left wrist for long her hand and arm shakes. Pain is dorsal and radiates up to middle forearm. She cannot tolerate oral NSAIDS due to GI upset. She has been wearing a brace which has helped some.  ? ?.. ?Active Ambulatory Problems  ?  Diagnosis Date Noted  ? Severe persistent asthma without complication 10/03/2016  ? Irregular periods/menstrual cycles 10/03/2016  ? Nausea 01/01/2018  ? Low energy 01/01/2018  ? Environmental and seasonal allergies 10/04/2018  ? Gastroesophageal reflux disease 01/03/2020  ? Multiple food allergies 05/04/2020  ? Trapezius muscle spasm 12/26/2020  ? Acute pain of left shoulder 12/26/2020  ? Depressed mood 08/16/2021  ? Severe persistent asthma with acute exacerbation 10/18/2021  ? ?Resolved Ambulatory Problems  ?  Diagnosis Date Noted  ? No Resolved Ambulatory Problems  ? ?Past Medical History:  ?Diagnosis Date  ? Asthma   ? Heart murmur   ? Migraines   ? Seizures ([facility])   ? ? ? ?ROS ?See HPI.  ?  ?Objective:  ?  ? ?BP 130/73   Pulse 71   Ht 5\' 3"  (1.6 m)   Wt 162 lb (73.5 kg)   SpO2 99%   BMI 28.70 kg/m?  ? ? ?Physical Exam ?Left wrist: ? ?No swelling, redness, or bruising noted. ?Tenderness to palpation in the central dorsal wrist. ?Resisted extension weak and painful 3/5 strength ?Resisted flexion 5/5 and no pain.  ?Hand grip 4/5 ?Negative tinels and phalens ? ? ?  ?Assessment & Plan:  ?..Carol [name] was seen today for arm pain. ? ?Diagnoses and all orders for  this visit: ? ?Left wrist pain ?-     diclofenac Sodium (VOLTAREN) 1 % GEL; Apply 4 g topically 4 (four) times daily. To affected joint. ? ? ?Suspect dorsal wrist extensor tendonitis. Treated with brace, exercises, ice, voltaren gel due to patient not tolerating the oral NSAID due to GI side effects. If not improving follow up with Dr. Joni Reining in the next 2 weeks.  ? ?Return in about 2 weeks (around 12/09/2021) for Dr. 12/11/2021 if not improving.  ? ? ?Karie Schwalbe, PA-C ? ?

## 2021-11-26 ENCOUNTER — Encounter: Payer: Self-pay | Admitting: Physician Assistant

## 2022-01-01 ENCOUNTER — Encounter: Payer: Self-pay | Admitting: Physician Assistant

## 2022-01-01 DIAGNOSIS — J4551 Severe persistent asthma with (acute) exacerbation: Secondary | ICD-10-CM

## 2022-01-01 MED ORDER — METHYLPREDNISOLONE 4 MG PO TBPK: ORAL_TABLET | ORAL | 0 refills | Status: DC

## 2022-01-01 NOTE — Telephone Encounter (Signed)
Hx of severe allergic reactions. Virtual on Monday. Needs to keep on hand a prednisone pack.

## 2022-01-06 ENCOUNTER — Telehealth: Payer: 59 | Admitting: Physician Assistant

## 2022-02-08 ENCOUNTER — Encounter: Payer: Self-pay | Admitting: Internal Medicine

## 2022-02-10 ENCOUNTER — Other Ambulatory Visit (HOSPITAL_COMMUNITY): Payer: Self-pay

## 2022-02-10 MED ORDER — DEXLANSOPRAZOLE 30 MG PO CPDR: 30.0000 mg | DELAYED_RELEASE_CAPSULE | Freq: Every day | ORAL | 1 refills | Status: DC

## 2022-02-10 NOTE — Telephone Encounter (Signed)
Please start PA for dexilant - formulary alternative not effective.

## 2022-02-10 NOTE — Telephone Encounter (Signed)
Dr. Celine Mans, please advise on pt's email. She is requesting to change back to Dexilant after insurance denied it, while Nexium is covered and has tried and failed. Pt is now having issues with GERD while on Nexium. Please advise if we can send in Dexilant and do PA if necessary. Thanks.

## 2022-02-11 ENCOUNTER — Other Ambulatory Visit: Payer: Self-pay | Admitting: Internal Medicine

## 2022-02-11 ENCOUNTER — Other Ambulatory Visit (HOSPITAL_COMMUNITY): Payer: Self-pay

## 2022-02-12 NOTE — Telephone Encounter (Signed)
Changes Requested   Name from pharmacy: DEXLANSOPRAZOLE DR 30 MG CAP       Will file in chart as: Dexlansoprazole 30 MG capsule DR    Possible duplicate: Hover to review recent actions on this medication   Sig: TAKE 1 CAPSULE BY MOUTH EVERY DAY   Disp:  90 capsule    Refills:  1   Start: 02/11/2022   Class: Normal   Non-formulary   Last ordered: 2 days ago (02/10/2022) by Charlott Holler, MD   Last refill: 02/10/2022   Rx #: [id number]   Pharmacy comment: Alternative Requested:NON FORMULARY.     To be filled at: [id number] - [place], [place] - [address]

## 2022-02-14 ENCOUNTER — Other Ambulatory Visit (HOSPITAL_COMMUNITY): Payer: Self-pay

## 2022-02-17 ENCOUNTER — Telehealth: Payer: Self-pay

## 2022-02-17 NOTE — Telephone Encounter (Signed)
Patient Advocate Encounter  Prior Authorization for Dexilant 30MG  dr capsules has been approved.   Key  PA Case ID: Linda Hedges Effective dates: 02/12/2022 through  02/13/2023  02/15/2023, CPhT Pharmacy Patient Advocate Specialist [facility] Patient Advocate Team Phone: [phone]   Fax: [phone]

## 2022-02-19 ENCOUNTER — Telehealth: Payer: Self-pay | Admitting: Internal Medicine

## 2022-02-20 NOTE — Telephone Encounter (Signed)
ATC Elrir[facility] from [facility] but due to long hold did not get in touch with anyone. Was on hold for 8 mins. Will try again.

## 2022-02-24 ENCOUNTER — Other Ambulatory Visit: Payer: Self-pay | Admitting: Physician Assistant

## 2022-02-24 DIAGNOSIS — J4551 Severe persistent asthma with (acute) exacerbation: Secondary | ICD-10-CM

## 2022-02-24 NOTE — Telephone Encounter (Signed)
Please advise 

## 2022-02-24 NOTE — Telephone Encounter (Signed)
Called [facility] and was on hold for 7 min  Will call back later

## 2022-02-25 ENCOUNTER — Other Ambulatory Visit (HOSPITAL_COMMUNITY): Payer: Self-pay

## 2022-02-25 NOTE — Telephone Encounter (Signed)
Tried calling again and on hold for 10 min  Pharm team- do you think pt needs PA? I am unable to tell why pharm is calling or able to reach anyone

## 2022-02-25 NOTE — Telephone Encounter (Signed)
Contacted insurance directly. PA was confirmed, however there was also a quantity limit for this medication. Obtained authorization for Quantity Limit Override. Approval #[id number] Approved 02/25/2022-02/25/2025 Patient is allowed to refill 30 capsules/30 days per refill.  Test claim with [facility] pharmacies shows $15 copay.

## 2022-02-28 NOTE — Telephone Encounter (Signed)
Patient Advocate Encounter  Prior Authorization for DEXLANSOPRAZOLE 30MG  has been approved.    PA# Effective dates: 8.1.23 through 8.1.26  Paige Monarrez B. CPhT P: [phone] F: [phone]

## 2022-03-07 ENCOUNTER — Telehealth: Payer: 59 | Admitting: Physician Assistant

## 2022-04-25 IMAGING — MG DIGITAL DIAGNOSTIC BILAT W/ TOMO W/ CAD
6 of 10 series · 6 of 30 positions shown · non-contrast
Comparison: None.

CLINICAL DATA: 29-year-old female with a palpable right breast lump
for several months.

EXAM:
DIGITAL DIAGNOSTIC BILATERAL MAMMOGRAM WITH CAD AND TOMO
ULTRASOUND RIGHT BREAST

[R CC synth-2D]
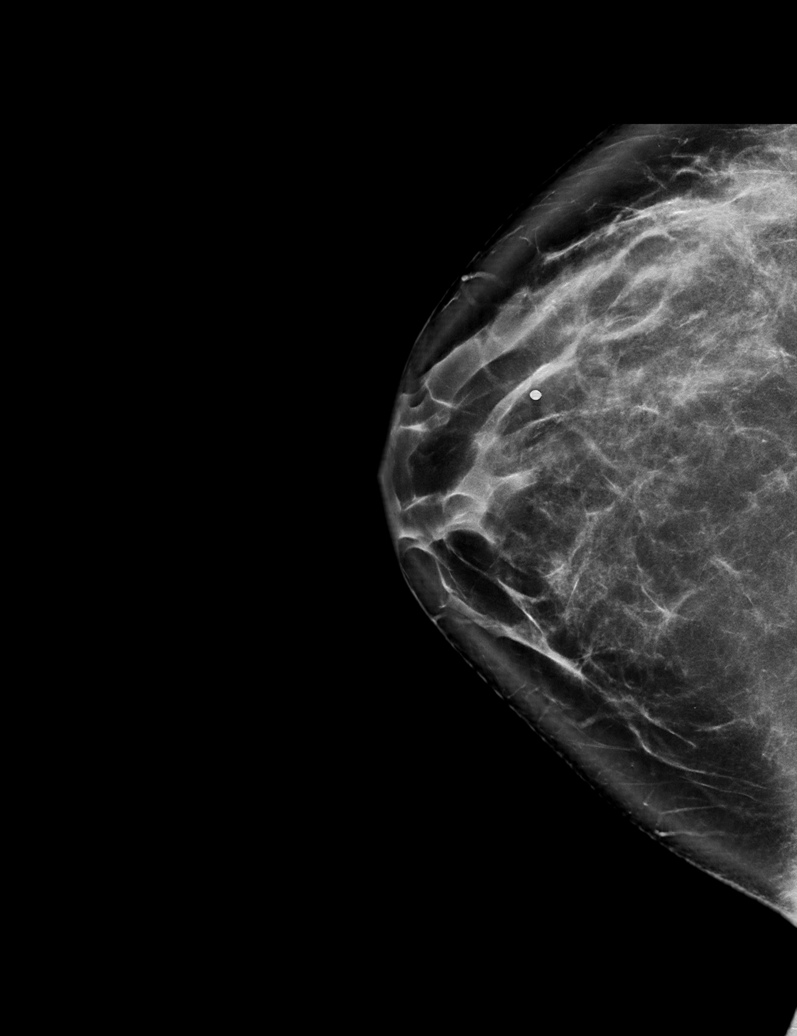

[R LM synth-2D]
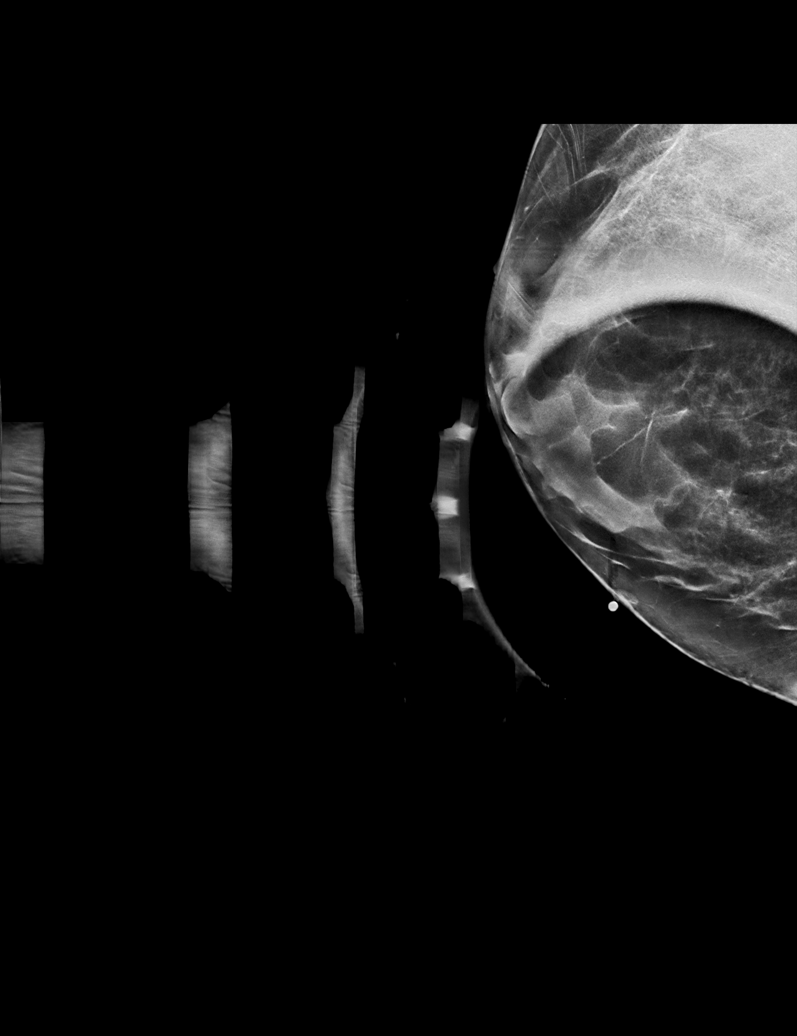

[L CC synth-2D]
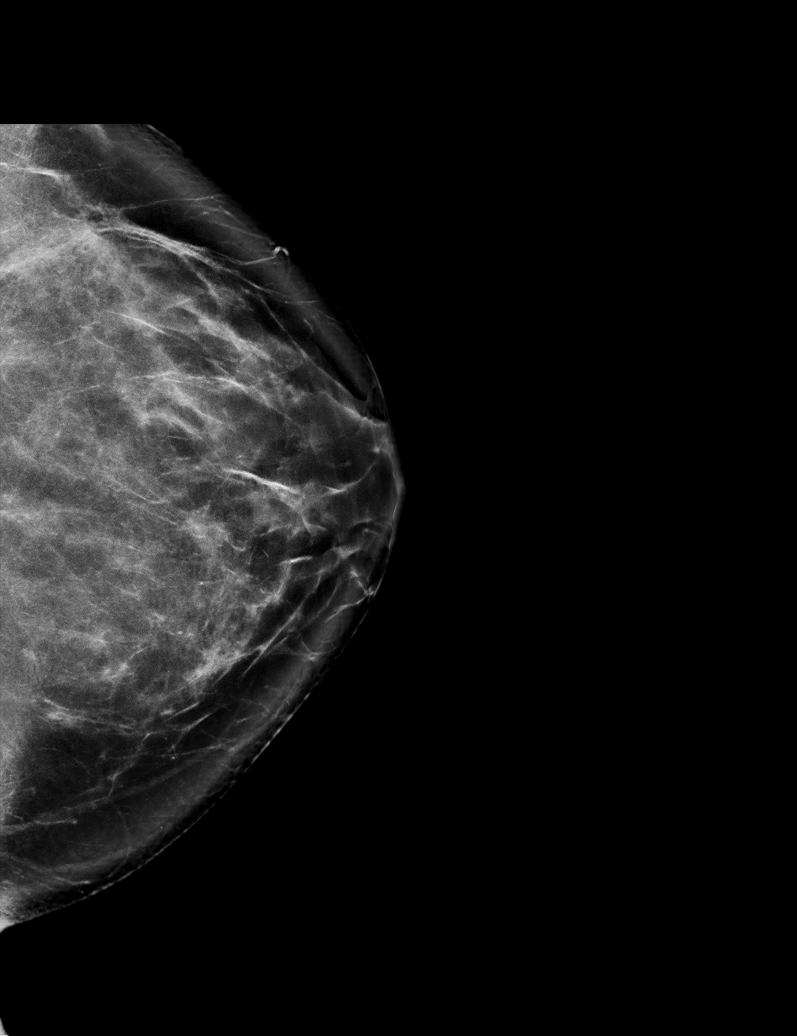

[L MLO synth-2D]
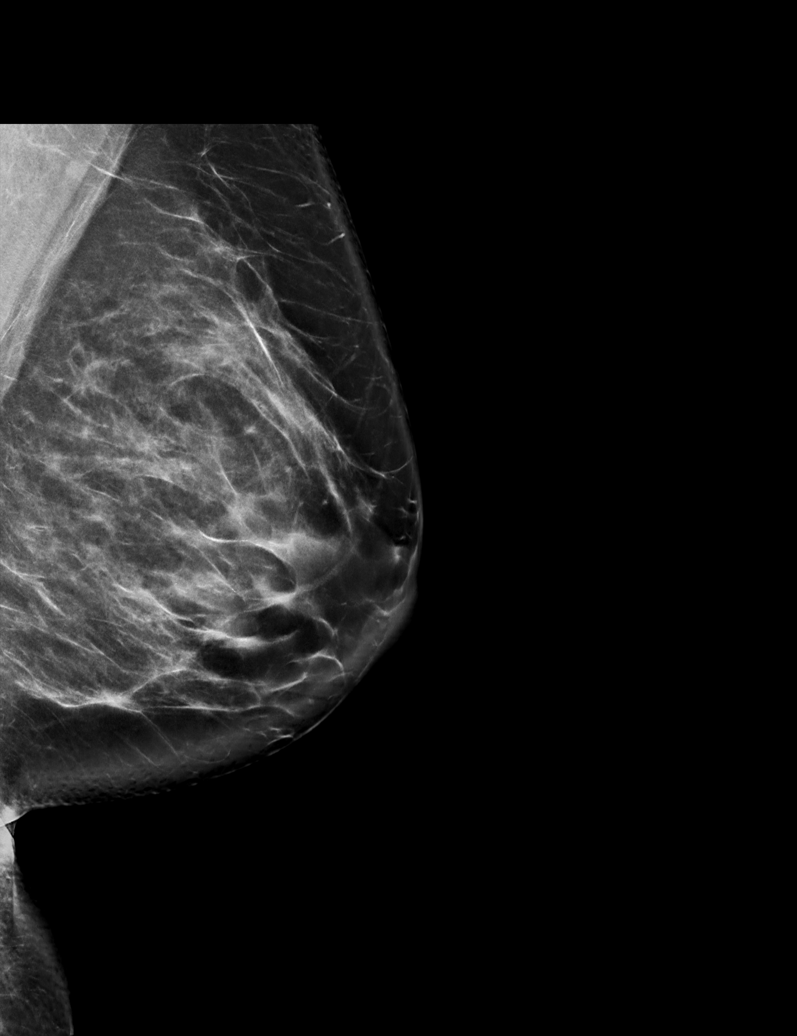

[R MLO synth-2D]
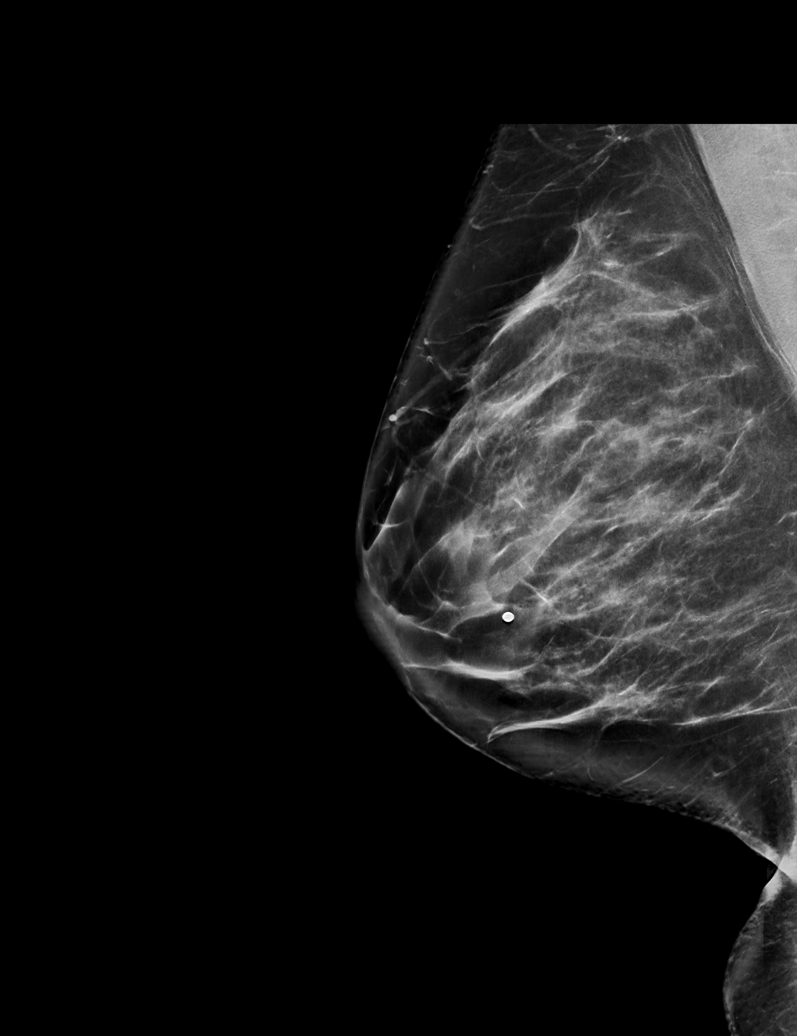

[L CC tomo · tomo slice 48/95.0]
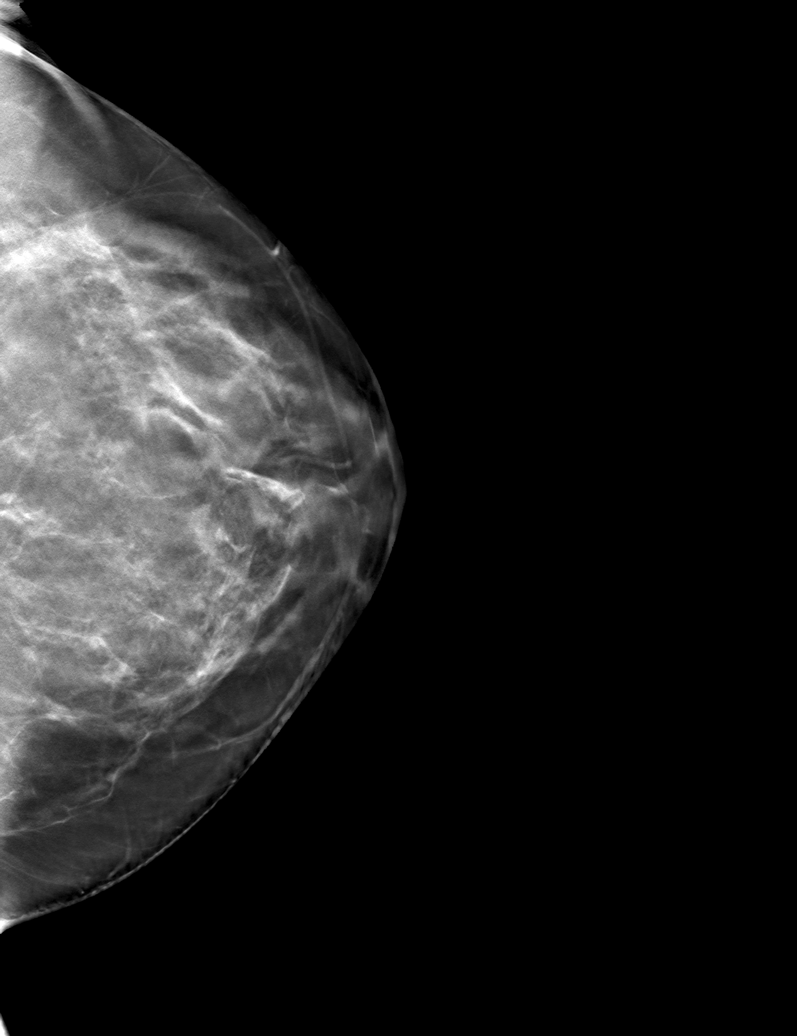

[6 of 30 positions shown; findings below may reference images not displayed]

ACR Breast Density Category c: The breast tissue is heterogeneously
dense, which may obscure small masses.
FINDINGS: Radiopaque BB in the lower outer quadrant of the right breast at the
site of the patient's palpable lump demonstrates a superficial oval,
circumscribed equal density mass.

No additional suspicious findings are identified in the remainder of
either breast.

Mammographic images were processed with CAD.

Targeted ultrasound is performed, showing an oval, circumscribed
anechoic mass located entirely within the skin at the 8 o'clock
position 3 cm from the nipple. It measures 5 x 4 x 2 mm. There is no
internal vascularity. Note is made of a tract to the overlying skin.
This correlates well with the mammographic finding.
IMPRESSION: Benign right breast sebaceous cyst corresponding with the patient's
palpable lump. No further imaging follow-up required.

RECOMMENDATION:
Screening mammogram at age 38 unless there are persistent or
intervening clinical concerns. (Code:L6-2-UTM)

I have discussed the findings and recommendations with the patient.
If applicable, a reminder letter will be sent to the patient
regarding the next appointment.

BI-RADS CATEGORY  2: Benign.

## 2022-05-20 ENCOUNTER — Other Ambulatory Visit: Payer: Self-pay | Admitting: Physician Assistant

## 2022-05-20 DIAGNOSIS — J4551 Severe persistent asthma with (acute) exacerbation: Secondary | ICD-10-CM

## 2022-07-09 ENCOUNTER — Encounter: Payer: Self-pay | Admitting: Physician Assistant

## 2022-07-09 DIAGNOSIS — N926 Irregular menstruation, unspecified: Secondary | ICD-10-CM

## 2022-07-10 MED ORDER — NORETHINDRONE ACET-ETHINYL EST 1.5-30 MG-MCG PO TABS: 1.0000 | ORAL_TABLET | Freq: Every day | ORAL | 0 refills | Status: DC

## 2022-07-15 ENCOUNTER — Telehealth (INDEPENDENT_AMBULATORY_CARE_PROVIDER_SITE_OTHER): Payer: 59 | Admitting: Physician Assistant

## 2022-07-15 ENCOUNTER — Encounter: Payer: Self-pay | Admitting: Physician Assistant

## 2022-07-15 VITALS — BP 117/58 | Temp 98.2°F

## 2022-07-15 DIAGNOSIS — L089 Local infection of the skin and subcutaneous tissue, unspecified: Secondary | ICD-10-CM | POA: Diagnosis not present

## 2022-07-15 DIAGNOSIS — J4551 Severe persistent asthma with (acute) exacerbation: Secondary | ICD-10-CM | POA: Diagnosis not present

## 2022-07-15 MED ORDER — MUPIROCIN 2 % EX OINT: TOPICAL_OINTMENT | CUTANEOUS | 3 refills | Status: DC

## 2022-07-15 MED ORDER — SULFAMETHOXAZOLE-TRIMETHOPRIM 800-160 MG PO TABS: 1.0000 | ORAL_TABLET | Freq: Two times a day (BID) | ORAL | 0 refills | Status: DC

## 2022-07-15 MED ORDER — ALBUTEROL SULFATE HFA 108 (90 BASE) MCG/ACT IN AERS: 2.0000 | INHALATION_SPRAY | Freq: Four times a day (QID) | RESPIRATORY_TRACT | 1 refills | Status: DC | PRN

## 2022-07-15 NOTE — Progress Notes (Addendum)
..  Virtual Visit via Video Note  I connected with Carol [name] on 07/15/22 at  1:40 PM EST by a video enabled telemedicine application and verified that I am speaking with the correct person using two identifiers.  Location: Patient: work Provider: clinic  .Marland KitchenParticipating in visit:  Patient: Carol [name] Provider: Tandy Gaw PA-C   I discussed the limitations of evaluation and management by telemedicine and the availability of in person appointments. The patient expressed understanding and agreed to proceed.  History of Present Illness: Pt is a 31 yo female who has ongoing skin infection in and around her belly button that will not resolve. 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 [facility] and given bactroban and resolved but came back around thanksgiving. She describes areas as warm and tender to touch. No fever, chills or discharge. She never took oral abx.   Severe ongoing asthma. Needs ventolin inhaler to use as needed.   .. Active Ambulatory Problems    Diagnosis Date Noted   Severe persistent asthma without complication 10/03/2016   Irregular periods/menstrual cycles 10/03/2016   Nausea 01/01/2018   Low energy 01/01/2018   Environmental and seasonal allergies 10/04/2018   Gastroesophageal reflux disease 01/03/2020   Multiple food allergies 05/04/2020   Trapezius muscle spasm 12/26/2020   Acute pain of left shoulder 12/26/2020   Depressed mood 08/16/2021   Severe persistent asthma with acute exacerbation 10/18/2021   Resolved Ambulatory Problems    Diagnosis Date Noted   No Resolved Ambulatory Problems   Past Medical History:  Diagnosis Date   Asthma    Heart murmur    Migraines    Seizures (HCC)        Observations/Objective: No acute distress  Video not working per patient redness in umbilical area extending up the left side and into abdomen. Erythematous and slightly  warm and tender to touch.   .. Today's Vitals   07/15/22 1311  BP: (!) 117/58  Temp: 98.2 F (36.8 C)   There is no height or weight on file to calculate BMI.    Assessment and Plan: Marland KitchenMarland KitchenTahjae was seen today for belly button infection.  Diagnoses and all orders for this visit:  Skin infection -     sulfamethoxazole-trimethoprim (BACTRIM DS) 800-160 MG tablet; Take 1 tablet by mouth 2 (two) times daily. For 7 days. -     mupirocin ointment (BACTROBAN) 2 %; Apply to affected area TID for 7 days.  Severe persistent asthma with acute exacerbation -     albuterol (VENTOLIN HFA) 108 (90 Base) MCG/ACT inhaler; Inhale 2 puffs into the lungs every 6 (six) hours as needed.   Start bactrim for staph infection orally Keep umbilica area clean and dry Avoid hydrogen peroxide or neosporin past 3 days Bactroban for 7 days then as needed for future infections.    Follow Up Instructions:    I discussed the assessment and treatment plan with the patient. The patient was provided an opportunity to ask questions and all were answered. The patient agreed with the plan and demonstrated an understanding of the instructions.   The patient was advised to call back or seek an in-person evaluation if the symptoms worsen or if the condition fails to improve as anticipated.    Tandy Gaw, PA-C

## 2022-08-04 ENCOUNTER — Encounter: Payer: Self-pay | Admitting: Physician Assistant

## 2022-08-04 DIAGNOSIS — N926 Irregular menstruation, unspecified: Secondary | ICD-10-CM

## 2022-08-04 DIAGNOSIS — J3089 Other allergic rhinitis: Secondary | ICD-10-CM

## 2022-08-04 DIAGNOSIS — J4551 Severe persistent asthma with (acute) exacerbation: Secondary | ICD-10-CM

## 2022-08-04 MED ORDER — IPRATROPIUM-ALBUTEROL 0.5-2.5 (3) MG/3ML IN SOLN: 3.0000 mL | Freq: Four times a day (QID) | RESPIRATORY_TRACT | 1 refills | Status: DC | PRN

## 2022-08-04 MED ORDER — NORETHINDRONE ACET-ETHINYL EST 1.5-30 MG-MCG PO TABS: 1.0000 | ORAL_TABLET | Freq: Every day | ORAL | 0 refills | Status: DC

## 2022-08-04 MED ORDER — FLUTICASONE-SALMETEROL 250-50 MCG/ACT IN AEPB: 1.0000 | INHALATION_SPRAY | Freq: Two times a day (BID) | RESPIRATORY_TRACT | 3 refills | Status: DC

## 2022-08-04 MED ORDER — MONTELUKAST SODIUM 10 MG PO TABS: 10.0000 mg | ORAL_TABLET | Freq: Every day | ORAL | 3 refills | Status: DC

## 2022-08-05 MED ORDER — DEXLANSOPRAZOLE 30 MG PO CPDR: 30.0000 mg | DELAYED_RELEASE_CAPSULE | Freq: Every day | ORAL | 3 refills | Status: DC

## 2022-08-13 MED ORDER — CLINDAMYCIN PHOSPHATE 1 % EX GEL: Freq: Two times a day (BID) | CUTANEOUS | 0 refills | Status: DC

## 2022-08-16 ENCOUNTER — Other Ambulatory Visit: Payer: Self-pay | Admitting: Internal Medicine

## 2022-09-28 ENCOUNTER — Other Ambulatory Visit: Payer: Self-pay | Admitting: Physician Assistant

## 2022-09-28 DIAGNOSIS — J4551 Severe persistent asthma with (acute) exacerbation: Secondary | ICD-10-CM

## 2022-09-29 ENCOUNTER — Encounter: Payer: Self-pay | Admitting: Physician Assistant

## 2022-09-29 MED ORDER — METHYLPREDNISOLONE 4 MG PO TBPK: ORAL_TABLET | ORAL | 0 refills | Status: DC

## 2022-10-01 ENCOUNTER — Telehealth: Payer: 59 | Admitting: Physician Assistant

## 2022-10-01 ENCOUNTER — Encounter: Payer: Self-pay | Admitting: Physician Assistant

## 2022-10-01 VITALS — Temp 98.4°F | Ht 63.0 in | Wt 162.0 lb

## 2022-10-01 DIAGNOSIS — J3089 Other allergic rhinitis: Secondary | ICD-10-CM

## 2022-10-01 DIAGNOSIS — J4551 Severe persistent asthma with (acute) exacerbation: Secondary | ICD-10-CM | POA: Diagnosis not present

## 2022-10-01 MED ORDER — PREDNISONE 10 MG PO TABS: ORAL_TABLET | ORAL | 0 refills | Status: DC

## 2022-10-01 NOTE — Progress Notes (Signed)
..  Virtual Visit via Video Note  I connected with Carol Carol [name] on 10/01/22 at 11:10 AM EST by a video enabled telemedicine application and verified that I am speaking with the correct person using two identifiers.  Location: Patient: home Provider: clinic  .Marland KitchenParticipating in visit:  Patient: Carol Carol [name] Provider: Iran Planas PA-C   I discussed the limitations of evaluation and management by telemedicine and the availability of in person appointments. The patient expressed understanding and agreed to proceed.  History of Present Illness: Pt is a 32 yo female with Carol [name] asthma who has had a recent acute exacerbation that started 7 days ago. Her last exacerbation was 12/2021. She was triggered by perfume. She is using duoneb every 4 hours. Continues on advair/singulair/allegra/flonase. Has started medrol dose pack. O2 stats 90-94 percent. She does feel like she is improving but worried medrol dose pack is not enough.   She has noticed that her last asthma attacks have been around her cycle. She wonders if there is any significance to this.   .. Active Ambulatory Problems    Diagnosis Date Noted   Carol [name] persistent asthma without complication [id number]   Irregular periods/menstrual cycles 10/03/2016   Nausea 01/01/2018   Low energy 01/01/2018   Environmental and seasonal allergies 10/04/2018   Gastroesophageal reflux disease 01/03/2020   Multiple food allergies 05/04/2020   Trapezius muscle spasm 12/26/2020   Acute pain of left shoulder 12/26/2020   Depressed mood 08/16/2021   Carol [name] persistent asthma with acute exacerbation 10/18/2021   Resolved Ambulatory Problems    Diagnosis Date Noted   No Resolved Ambulatory Problems   Past Medical History:  Diagnosis Date   Asthma    Heart murmur    Migraines    Seizures (HCC)        Observations/Objective: No acute distress Labored breathing and coughing with talking Dry cough  .Marland Kitchen Today's Vitals   10/01/22 1022  Temp: 98.4  F (36.9 C)  TempSrc: Oral  SpO2: 94%  Weight: 162 lb (73.5 kg)  Height: '5\' 3"'$  (1.6 m)   Body mass index is 28.7 kg/m.    Assessment and Plan: Marland KitchenMarland KitchenGloribel was seen today for asthma.  Diagnoses and all orders for this visit:  Carol [name] persistent asthma with acute exacerbation -     predniSONE (DELTASONE) 10 MG tablet; Take 3 tablets for 3 days, take 2 tablets for 3 days, take 1 tablet for 3 days, take 1/2 tablet for 4 days.  Environmental and seasonal allergies -     predniSONE (DELTASONE) 10 MG tablet; Take 3 tablets for 3 days, take 2 tablets for 3 days, take 1 tablet for 3 days, take 1/2 tablet for 4 days.   Pt is improving but may need a few more extended days of prednisone Pt will continue advair, singulair, zyrtec, duoneb Follow up as needed or if symptoms worse or persist  Needs CPE please schedule.    Follow Up Instructions:    I discussed the assessment and treatment plan with the patient. The patient was provided an opportunity to ask questions and all were answered. The patient agreed with the plan and demonstrated an understanding of the instructions.   The patient was advised to call back or seek an in-person evaluation if the symptoms worsen or if the condition fails to improve as anticipated.   Iran Planas, PA-C

## 2022-10-01 NOTE — Progress Notes (Signed)
Started Friday  Taking Duoneb and Methylprednisone (started Monday)

## 2022-10-18 ENCOUNTER — Encounter: Payer: Self-pay | Admitting: Physician Assistant

## 2022-10-20 ENCOUNTER — Encounter: Payer: Self-pay | Admitting: Physician Assistant

## 2022-10-20 NOTE — Telephone Encounter (Signed)
Yes she has severe allergic reactions.

## 2022-11-04 ENCOUNTER — Ambulatory Visit: Payer: 59 | Admitting: Family Medicine

## 2022-11-04 VITALS — BP 128/76 | HR 95 | Ht 63.0 in | Wt 167.0 lb

## 2022-11-04 DIAGNOSIS — J4551 Severe persistent asthma with (acute) exacerbation: Secondary | ICD-10-CM

## 2022-11-04 MED ORDER — FLUTICASONE-SALMETEROL 500-50 MCG/ACT IN AEPB: 1.0000 | INHALATION_SPRAY | Freq: Two times a day (BID) | RESPIRATORY_TRACT | 1 refills | Status: DC

## 2022-11-04 MED ORDER — PREDNISONE 20 MG PO TABS: ORAL_TABLET | ORAL | 0 refills | Status: AC

## 2022-11-04 MED ORDER — BENZONATATE 200 MG PO CAPS: 200.0000 mg | ORAL_CAPSULE | Freq: Three times a day (TID) | ORAL | 0 refills | Status: DC | PRN

## 2022-11-04 NOTE — Progress Notes (Signed)
Acute Office Visit  Subjective:     Patient ID: Carol [name], female    DOB: 1990-07-29, 32 y.o.   MRN: [id number]  Chief Complaint  Patient presents with   Asthma    HPI Patient is in today for asthma exacerbation.  She was seen about 4 weeks ago for an acute flare.  She was given some prednisone after Medrol Dosepak she really only needed it for a few more days and then felt better she said she had about 2 good weeks.  She started running again for exercise and within a couple days she started to flare again.  She ended up restarting the prednisone and [facility].  She still has about 3 days left on her current taper.  She has been using her nebulizer multiple times a day she has DuoNeb.  She is using her Advair 250 twice a day.  Also on Allegra, Singulair and Zyrtec.  She does have a history of GERD which can also trigger her asthma so she is also on Dexilant has been taking it regularly.  She is having a persistent cough.  No watery itchy eyes.  Little bit of postnasal drip but no productive sputum or nasal congestion.  She has been in the yellow zone at home on her peak flow meter.  ROS      Objective:    BP 128/76   Pulse 95   Ht 5\' 3"  (1.6 m)   Wt 167 lb (75.8 kg)   SpO2 100%   BMI 29.58 kg/m    Physical Exam Constitutional:      Appearance: She is well-developed.  HENT:     Head: Normocephalic and atraumatic.     Right Ear: Tympanic membrane, ear canal and external ear normal.     Left Ear: Tympanic membrane, ear canal and external ear normal.     Nose: Nose normal.     Mouth/Throat:     Pharynx: Oropharynx is clear.  Eyes:     Conjunctiva/sclera: Conjunctivae normal.     Pupils: Pupils are equal, round, and reactive to light.  Neck:     Thyroid: No thyromegaly.  Cardiovascular:     Rate and Rhythm: Normal rate and regular rhythm.     Heart sounds: Normal heart sounds.  Pulmonary:     Effort: Pulmonary effort  is normal.     Breath sounds: Normal breath sounds. No wheezing.  Musculoskeletal:     Cervical back: Neck supple.  Lymphadenopathy:     Cervical: No cervical adenopathy.  Skin:    General: Skin is warm and dry.  Neurological:     Mental Status: She is alert and oriented to person, place, and time.     No results found for any visits on 11/04/22.      Assessment & Plan:   Problem List Items Addressed This Visit       Respiratory   Severe persistent asthma with acute exacerbation - Primary   Relevant Medications   predniSONE (DELTASONE) 20 MG tablet   fluticasone-salmeterol (ADVAIR DISKUS) 500-50 MCG/ACT AEPB    Asthma exacerbation-we will increase Advair to 550.  She preferred that to be sent to mail order.  Will bump up the prednisone she is currently on 10 mg and will increase back to 40 mg for 6 days and then taper off.  Continue with Singulair, antihistamines and DuoNeb as needed during the day.  If not improving or getting  recurrent flares then we did discuss getting in with asthma allergy specialist here locally in [place].  On exam her lungs are clear but she is having definitely a lot of upper airway cough and spasms so we will send over [employer] as well.  Meds ordered this encounter  Medications   predniSONE (DELTASONE) 20 MG tablet    Sig: Take 2 tablets (40 mg total) by mouth daily with breakfast for 6 days, THEN 1 tablet (20 mg total) daily with breakfast for 4 days, THEN 0.5 tablets (10 mg total) daily with breakfast for 4 days.    Dispense:  18 tablet    Refill:  0   benzonatate (TESSALON) 200 MG capsule    Sig: Take 1 capsule (200 mg total) by mouth 3 (three) times daily as needed for cough.    Dispense:  30 capsule    Refill:  0   fluticasone-salmeterol (ADVAIR DISKUS) 500-50 MCG/ACT AEPB    Sig: Inhale 1 puff into the lungs in the morning and at bedtime.    Dispense:  60 each    Refill:  1    Return if symptoms worsen or fail to  improve.  Nani Gasser, MD

## 2022-11-10 ENCOUNTER — Telehealth: Payer: 59 | Admitting: Physician Assistant

## 2023-01-19 ENCOUNTER — Telehealth: Payer: 59 | Admitting: Physician Assistant

## 2023-01-21 ENCOUNTER — Encounter: Payer: Self-pay | Admitting: Physician Assistant

## 2023-01-21 ENCOUNTER — Telehealth: Payer: 59 | Admitting: Physician Assistant

## 2023-01-21 VITALS — Temp 98.5°F | Ht 62.0 in | Wt 165.0 lb

## 2023-01-21 DIAGNOSIS — B9689 Other specified bacterial agents as the cause of diseases classified elsewhere: Secondary | ICD-10-CM | POA: Diagnosis not present

## 2023-01-21 DIAGNOSIS — J4550 Severe persistent asthma, uncomplicated: Secondary | ICD-10-CM | POA: Diagnosis not present

## 2023-01-21 DIAGNOSIS — J329 Chronic sinusitis, unspecified: Secondary | ICD-10-CM | POA: Diagnosis not present

## 2023-01-21 DIAGNOSIS — L089 Local infection of the skin and subcutaneous tissue, unspecified: Secondary | ICD-10-CM | POA: Diagnosis not present

## 2023-01-21 DIAGNOSIS — J4551 Severe persistent asthma with (acute) exacerbation: Secondary | ICD-10-CM

## 2023-01-21 MED ORDER — AZITHROMYCIN 250 MG PO TABS: ORAL_TABLET | ORAL | 0 refills | Status: DC

## 2023-01-21 MED ORDER — PREDNISONE 20 MG PO TABS
ORAL_TABLET | ORAL | 0 refills | Status: DC
Start: 2023-01-21 — End: 2023-04-03

## 2023-01-21 NOTE — Progress Notes (Signed)
..  Virtual Visit via Video Note  I connected with Carol [name] on 01/21/23 at  8:10 AM EDT by a video enabled telemedicine application and verified that I am speaking with the correct person using two identifiers.  Location: Patient: home Provider: clinic  .Marland KitchenParticipating in visit:  Patient: Carol [name] Provider: Tandy Gaw PA-C   I discussed the limitations of evaluation and management by telemedicine and the availability of in person appointments. The patient expressed understanding and agreed to proceed.  History of Present Illness: Pt is a 32 yo female with severe asthma who calls into the clinic with 3 weeks of sinus pressure, congestion, head pressure and cough. No asthma exacerbation. Cough is mostly dry but she does have some production. Taking OTC medications and daily inhalers.   Recurrent belly button infection. No known trigger. Bactroban as needed does resolve it for a while.   .. Active Ambulatory Problems    Diagnosis Date Noted   Severe persistent asthma without complication 10/03/2016   Irregular periods/menstrual cycles 10/03/2016   Nausea 01/01/2018   Low energy 01/01/2018   Environmental and seasonal allergies 10/04/2018   Gastroesophageal reflux disease 01/03/2020   Multiple food allergies 05/04/2020   Trapezius muscle spasm 12/26/2020   Acute pain of left shoulder 12/26/2020   Depressed mood 08/16/2021   Severe persistent asthma with acute exacerbation 10/18/2021   Bacterial sinusitis 01/21/2023   Skin infection 01/21/2023   Resolved Ambulatory Problems    Diagnosis Date Noted   No Resolved Ambulatory Problems   Past Medical History:  Diagnosis Date   Asthma    Heart murmur    Migraines    Seizures (HCC)       Observations/Objective: No acute distress Normal mood and appearance Dry cough  .Marland Kitchen Today's Vitals   01/21/23 0752  Temp: 98.5 F (36.9 C)  Weight: 165 lb (74.8 kg)  Height: 5\' 2"  (1.575 m)   Body mass index is 30.18  kg/m.    Assessment and Plan: Marland KitchenMarland KitchenLaniah was seen today for cough and belly button infection.  Diagnoses and all orders for this visit:  Bacterial sinusitis -     azithromycin (ZITHROMAX Z-PAK) 250 MG tablet; Take 2 tablets (500 mg) on  Day 1,  followed by 1 tablet (250 mg) once daily on Days 2 through 5.  Severe persistent asthma without complication -     predniSONE (DELTASONE) 20 MG tablet; Take 3 tablets for 3 days, take 2 tablets for 3 days, take 1 tablet for 3 days, take 1/2 tablet for 4 days.  Skin infection   Hold prednisone for asthma flare as needed Start zpak for sinusitis  Discussed recurrent skin infections and causes use bactroban as needed avoid moisture in area and friction.  Follow up as needed or if symptoms persist or worsen    Follow Up Instructions:    I discussed the assessment and treatment plan with the patient. The patient was provided an opportunity to ask questions and all were answered. The patient agreed with the plan and demonstrated an understanding of the instructions.   The patient was advised to call back or seek an in-person evaluation if the symptoms worsen or if the condition fails to improve as anticipated.   Tandy Gaw, PA-C

## 2023-01-23 MED ORDER — ALBUTEROL SULFATE HFA 108 (90 BASE) MCG/ACT IN AERS
2.0000 | INHALATION_SPRAY | Freq: Four times a day (QID) | RESPIRATORY_TRACT | 1 refills | Status: DC | PRN
Start: 2023-01-23 — End: 2023-01-27

## 2023-01-27 MED ORDER — ALBUTEROL SULFATE HFA 108 (90 BASE) MCG/ACT IN AERS
2.0000 | INHALATION_SPRAY | Freq: Four times a day (QID) | RESPIRATORY_TRACT | 1 refills | Status: AC | PRN
Start: 2023-01-27 — End: ?

## 2023-01-27 NOTE — Addendum Note (Signed)
Addended by: Jomarie Longs on: 01/27/2023 01:23 PM   Modules accepted: Orders

## 2023-01-29 ENCOUNTER — Encounter: Payer: Self-pay | Admitting: Physician Assistant

## 2023-04-03 ENCOUNTER — Telehealth (INDEPENDENT_AMBULATORY_CARE_PROVIDER_SITE_OTHER): Payer: 59 | Admitting: Physician Assistant

## 2023-04-03 ENCOUNTER — Encounter: Payer: Self-pay | Admitting: Physician Assistant

## 2023-04-03 VITALS — Temp 101.5°F | Ht 62.0 in | Wt 165.0 lb

## 2023-04-03 DIAGNOSIS — U071 COVID-19: Secondary | ICD-10-CM

## 2023-04-03 DIAGNOSIS — J4550 Severe persistent asthma, uncomplicated: Secondary | ICD-10-CM

## 2023-04-03 MED ORDER — PREDNISONE 20 MG PO TABS
ORAL_TABLET | ORAL | 0 refills | Status: DC
Start: 2023-04-03 — End: 2023-07-28

## 2023-04-03 MED ORDER — MOLNUPIRAVIR EUA 200MG CAPSULE
4.0000 | ORAL_CAPSULE | Freq: Two times a day (BID) | ORAL | 0 refills | Status: AC
Start: 2023-04-03 — End: 2023-04-08

## 2023-04-03 NOTE — Progress Notes (Unsigned)
..Virtual Visit via Video Note  I connected with Carol [name] on 04/03/23 at  3:40 PM EDT by a video enabled telemedicine application and verified that I am speaking with the correct person using two identifiers.  Location: Patient: Carol [name] Provider: clinic  .Marland KitchenParticipating in visit:  Patient: Carol [name] Provider: Tandy Gaw PA-C Provider in training: Weston Anna PA-S    I discussed the limitations of evaluation and management by telemedicine and the availability of in person appointments. The patient expressed understanding and agreed to proceed.  History of Present Illness: Pt is a 32 yo female with severe asthma who presents to the clinic with positive home covid test today. Her symptoms started this morning. She had positive covid exposure with student. She has never had covid before. She has had all her covid vaccinations except the most recent booster. She is having a dry cough, fever, chills, SOB, and chest tightness. She is using her duoneb regularly.   .. Active Ambulatory Problems    Diagnosis Date Noted   Severe persistent asthma without complication 10/03/2016   Irregular periods/menstrual cycles 10/03/2016   Nausea 01/01/2018   Low energy 01/01/2018   Environmental and seasonal allergies 10/04/2018   Gastroesophageal reflux disease 01/03/2020   Multiple food allergies 05/04/2020   Trapezius muscle spasm 12/26/2020   Acute pain of left shoulder 12/26/2020   Depressed mood 08/16/2021   Severe persistent asthma with acute exacerbation 10/18/2021   Bacterial sinusitis 01/21/2023   Skin infection 01/21/2023   Resolved Ambulatory Problems    Diagnosis Date Noted   No Resolved Ambulatory Problems   Past Medical History:  Diagnosis Date   Asthma    Heart murmur    Migraines    Seizures (HCC)        Observations/Objective: Pt is pale in appearance She does have some increased respirations with dry cough if deeper breaths are taken  .Marland Kitchen Today's Vitals    04/03/23 1544  Temp: (!) 101.5 F (38.6 C)  Weight: 165 lb (74.8 kg)  Height: 5\' 2"  (1.575 m)   Body mass index is 30.18 kg/m.    Assessment and Plan: Marland KitchenMarland KitchenShakinah was seen today for covid positive.  Diagnoses and all orders for this visit:  COVID-19 virus infection -     MyChart COVID-19 home monitoring program; Future -     Temperature monitoring; Future -     predniSONE (DELTASONE) 20 MG tablet; Take 3 tablets for 3 days, take 2 tablets for 3 days, take 1 tablet for 3 days, take 1/2 tablet for 4 days. -     molnupiravir EUA (LAGEVRIO) 200 mg CAPS capsule; Take 4 capsules (800 mg total) by mouth 2 (two) times daily for 5 days.  Severe persistent asthma without complication -     MyChart COVID-19 home monitoring program; Future -     Temperature monitoring; Future -     predniSONE (DELTASONE) 20 MG tablet; Take 3 tablets for 3 days, take 2 tablets for 3 days, take 1 tablet for 3 days, take 1/2 tablet for 4 days. -     molnupiravir EUA (LAGEVRIO) 200 mg CAPS capsule; Take 4 capsules (800 mg total) by mouth 2 (two) times daily for 5 days.   Start molnupivir due to advair and paxlovid interaction Continue to use duoneb as needed up to every 3-4 hours Rest and hydrate Ok to use delsym OTC for cough Prednisone given but would like to hold off at least one day unless pulse ox or breathing gets worse due  to data that starting prednisone too soon can decrease ability to fight covid with natural antibodies Discussed red flag symptoms and when to go to ED/UC.   Please get flu shot as soon as you feel better within the next month.  Pt may wait up to 3 months to get covid booster due to infection   Follow Up Instructions:    I discussed the assessment and treatment plan with the patient. The patient was provided an opportunity to ask questions and all were answered. The patient agreed with the plan and demonstrated an understanding of the instructions.   The patient was advised to call  back or seek an in-person evaluation if the symptoms worsen or if the condition fails to improve as anticipated.     Tandy Gaw, PA-C

## 2023-04-03 NOTE — Progress Notes (Unsigned)
Tested: 04/03/2023 Symptoms started: 04/03/23  Clammy, freezing, headache, difficulty breathing

## 2023-04-06 ENCOUNTER — Encounter: Payer: Self-pay | Admitting: Physician Assistant

## 2023-04-07 ENCOUNTER — Telehealth: Payer: Self-pay

## 2023-04-07 NOTE — Telephone Encounter (Signed)
Called patient, no answer. Sent COVID care instructions for SOB to my chart.

## 2023-04-10 ENCOUNTER — Telehealth: Payer: 59 | Admitting: Physician Assistant

## 2023-04-10 ENCOUNTER — Encounter: Payer: Self-pay | Admitting: Physician Assistant

## 2023-04-10 VITALS — Temp 98.5°F | Ht 62.0 in | Wt 165.0 lb

## 2023-04-10 DIAGNOSIS — U071 COVID-19: Secondary | ICD-10-CM

## 2023-04-10 DIAGNOSIS — R5381 Other malaise: Secondary | ICD-10-CM | POA: Diagnosis not present

## 2023-04-10 DIAGNOSIS — J4551 Severe persistent asthma with (acute) exacerbation: Secondary | ICD-10-CM

## 2023-04-10 DIAGNOSIS — R0982 Postnasal drip: Secondary | ICD-10-CM

## 2023-04-10 NOTE — Progress Notes (Signed)
Day 5 of Covid symptoms. Finished molnupivir.

## 2023-04-10 NOTE — Progress Notes (Addendum)
..  Virtual Visit via Video Note  I connected with [name] on 04/10/23 at  1:00 PM EDT by a video enabled telemedicine application and verified that I am speaking with the correct person using two identifiers.  Location: Patient: home Provider: clinic  .Marland KitchenParticipating in visit:  Patient: Carol [name] Provider: Tandy Gaw PA-C Provider in training: Weston Anna PA-S   I discussed the limitations of evaluation and management by telemedicine and the availability of in person appointments. The patient expressed understanding and agreed to proceed.  History of Present Illness: Pt is a 32 yo female with severe asthma who calls into the clinic on day 5 of covid infection. She has finished molnupivir. She is feeling better from most covid symptoms but she continues to have fatigue and SOB and chest tightness. Yesterday was really bad and she was having to use duoneb every 2 hours. Today she feels a little better but having significant problems with chest tightness. She does have post nasal drip and productive cough. No fever. She is taking flonase and mucinex currently. Pt is able to lay flat and sleep. Denies any leg swelling, cramping, pain. She has not started the prednisone yet.   .. Active Ambulatory Problems    Diagnosis Date Noted   Severe persistent asthma without complication 10/03/2016   Irregular periods/menstrual cycles 10/03/2016   Nausea 01/01/2018   Low energy 01/01/2018   Environmental and seasonal allergies 10/04/2018   Gastroesophageal reflux disease 01/03/2020   Multiple food allergies 05/04/2020   Trapezius muscle spasm 12/26/2020   Acute pain of left shoulder 12/26/2020   Depressed mood 08/16/2021   Severe persistent asthma with acute exacerbation 10/18/2021   Bacterial sinusitis 01/21/2023   Skin infection 01/21/2023   Resolved Ambulatory Problems    Diagnosis Date Noted   No Resolved Ambulatory Problems   Past Medical History:  Diagnosis Date   Asthma     Heart murmur    Migraines    Seizures (HCC)     Observations/Objective: Pt does have some labored breathing with productive cough Pale appearance Hoarse voice  .Marland Kitchen Today's Vitals   04/10/23 1135  Temp: 98.5 F (36.9 C)  TempSrc: Oral  SpO2: 97%  Weight: 165 lb (74.8 kg)  Height: 5\' 2"  (1.575 m)   Body mass index is 30.18 kg/m.   Assessment and Plan: Marland KitchenMarland KitchenDarlette was seen today for medical management of chronic issues.  Diagnoses and all orders for this visit:  COVID-19 virus infection  Malaise and fatigue  Post-nasal drip  Severe persistent asthma with acute exacerbation   Start the prednisone taper at this point Continue with symptomatic care with mucinex and flonase Reassuring that pulse ox is staying good Ok to continue rescue inhaler/neb every 3-4 hours as needed No red flags symptoms of PE/DVT. Follow up with how you are doing on Monday or go to UC/ED if symptoms worsening   Follow Up Instructions:    I discussed the assessment and treatment plan with the patient. The patient was provided an opportunity to ask questions and all were answered. The patient agreed with the plan and demonstrated an understanding of the instructions.   The patient was advised to call back or seek an in-person evaluation if the symptoms worsen or if the condition fails to improve as anticipated.    Tandy Gaw, PA-C

## 2023-04-12 ENCOUNTER — Encounter: Payer: Self-pay | Admitting: Physician Assistant

## 2023-04-13 ENCOUNTER — Other Ambulatory Visit: Payer: Self-pay | Admitting: Physician Assistant

## 2023-04-13 DIAGNOSIS — J4550 Severe persistent asthma, uncomplicated: Secondary | ICD-10-CM

## 2023-04-13 DIAGNOSIS — U071 COVID-19: Secondary | ICD-10-CM

## 2023-04-14 ENCOUNTER — Telehealth: Payer: 59 | Admitting: Physician Assistant

## 2023-04-15 ENCOUNTER — Telehealth: Payer: 59 | Admitting: Physician Assistant

## 2023-04-15 MED ORDER — AZITHROMYCIN 250 MG PO TABS: ORAL_TABLET | ORAL | 0 refills | Status: DC

## 2023-04-22 ENCOUNTER — Encounter: Payer: Self-pay | Admitting: Physician Assistant

## 2023-04-22 ENCOUNTER — Telehealth (INDEPENDENT_AMBULATORY_CARE_PROVIDER_SITE_OTHER): Payer: 59 | Admitting: Physician Assistant

## 2023-04-22 DIAGNOSIS — J4550 Severe persistent asthma, uncomplicated: Secondary | ICD-10-CM | POA: Diagnosis not present

## 2023-04-22 DIAGNOSIS — R0602 Shortness of breath: Secondary | ICD-10-CM

## 2023-04-22 DIAGNOSIS — R519 Headache, unspecified: Secondary | ICD-10-CM | POA: Diagnosis not present

## 2023-04-22 DIAGNOSIS — U071 COVID-19: Secondary | ICD-10-CM

## 2023-04-22 DIAGNOSIS — F19982 Other psychoactive substance use, unspecified with psychoactive substance-induced sleep disorder: Secondary | ICD-10-CM | POA: Insufficient documentation

## 2023-04-22 DIAGNOSIS — R052 Subacute cough: Secondary | ICD-10-CM

## 2023-04-22 MED ORDER — PREDNISONE 10 MG PO TABS
10.0000 mg | ORAL_TABLET | Freq: Every day | ORAL | 0 refills | Status: DC
Start: 2023-04-22 — End: 2023-07-28

## 2023-04-22 NOTE — Progress Notes (Signed)
..Virtual Visit via Video Note  I connected with Carol [name] on 04/22/23 at  8:10 AM EDT by a video enabled telemedicine application and verified that I am speaking with the correct person using two identifiers.  Location: Patient: home Provider: clinic  .Marland KitchenParticipating in visit:  Patient: Carol [name] Provider: Tandy Gaw PA-C Provider in training: Weston Anna PA-S   I discussed the limitations of evaluation and management by telemedicine and the availability of in person appointments. The patient expressed understanding and agreed to proceed.  History of Present Illness: Pt is a 32 yo female on Day 41 of covid. She is doing better. Her sinus symptoms have improved. She finished zpak. She continues to have SOB/cough/chest tightness throughout the day. She is watching her pulse ox and staying 95 to 98 percent. She is on her last day of prednisone and worried about coming off of it. She is still needing 2-3 duonebs a day. She is having daily headaches and trouble sleeping. She continues on her advair inhaler twice a day.  .. Active Ambulatory Problems    Diagnosis Date Noted   Severe persistent asthma without complication 10/03/2016   Irregular periods/menstrual cycles 10/03/2016   Nausea 01/01/2018   Low energy 01/01/2018   Environmental and seasonal allergies 10/04/2018   Gastroesophageal reflux disease 01/03/2020   Multiple food allergies 05/04/2020   Trapezius muscle spasm 12/26/2020   Acute pain of left shoulder 12/26/2020   Depressed mood 08/16/2021   Severe persistent asthma with acute exacerbation 10/18/2021   Bacterial sinusitis 01/21/2023   Skin infection 01/21/2023   Drug-induced insomnia (HCC) 04/22/2023   Resolved Ambulatory Problems    Diagnosis Date Noted   No Resolved Ambulatory Problems   Past Medical History:  Diagnosis Date   Asthma    Heart murmur    Migraines    Seizures (HCC)     Observations/Objective: No acute distress Pt has a dry cough  while talking Normal mood and appearance  .Marland Kitchen Today's Vitals   04/22/23 0801  SpO2: 96%    Assessment and Plan: Marland KitchenMarland KitchenDiagnoses and all orders for this visit:  COVID-19 virus infection -     DG Chest 2 View; Future -     predniSONE (DELTASONE) 10 MG tablet; Take 1 tablet (10 mg total) by mouth daily with breakfast.  SOB (shortness of breath) -     DG Chest 2 View; Future  Subacute cough -     DG Chest 2 View; Future  Severe persistent asthma without complication -     DG Chest 2 View; Future -     predniSONE (DELTASONE) 10 MG tablet; Take 1 tablet (10 mg total) by mouth daily with breakfast.  Drug-induced insomnia (HCC)  Acute nonintractable headache, unspecified headache type   Covid day 19 Will get CXR due to persistent cough and SOB Pulse ox is reassuring in high 90s Will give 10mg  of prednisone for 5 more days Unisom for insomnia Continue duoneb 2-3 times a day as needed Continue advair Discussed with patient post viral/covid cough and symptoms can linger and continuing symptomatic care Start magnesium 400mg  at bedtime for headaches, discussed we can see post covid headaches Follow up as needed if symptoms persist or worsen    Follow Up Instructions:    I discussed the assessment and treatment plan with the patient. The patient was provided an opportunity to ask questions and all were answered. The patient agreed with the plan and demonstrated an understanding of the instructions.   The patient was  advised to call back or seek an in-person evaluation if the symptoms worsen or if the condition fails to improve as anticipated.  Tandy Gaw, PA-C

## 2023-04-23 ENCOUNTER — Ambulatory Visit: Payer: 59

## 2023-04-23 DIAGNOSIS — R0602 Shortness of breath: Secondary | ICD-10-CM

## 2023-04-23 DIAGNOSIS — R052 Subacute cough: Secondary | ICD-10-CM

## 2023-04-23 DIAGNOSIS — J4550 Severe persistent asthma, uncomplicated: Secondary | ICD-10-CM | POA: Diagnosis not present

## 2023-04-23 DIAGNOSIS — U071 COVID-19: Secondary | ICD-10-CM

## 2023-04-23 NOTE — Progress Notes (Signed)
No infection seen on CXR.

## 2023-05-03 ENCOUNTER — Other Ambulatory Visit: Payer: Self-pay | Admitting: Family Medicine

## 2023-05-03 DIAGNOSIS — J4551 Severe persistent asthma with (acute) exacerbation: Secondary | ICD-10-CM

## 2023-05-04 ENCOUNTER — Encounter: Payer: Self-pay | Admitting: Physician Assistant

## 2023-05-04 DIAGNOSIS — J4550 Severe persistent asthma, uncomplicated: Secondary | ICD-10-CM

## 2023-05-12 NOTE — Addendum Note (Signed)
Addended by: Jomarie Longs on: 05/12/2023 01:38 PM   Modules accepted: Orders

## 2023-07-06 ENCOUNTER — Encounter: Payer: Self-pay | Admitting: Physician Assistant

## 2023-07-06 ENCOUNTER — Ambulatory Visit: Payer: 59 | Admitting: Physician Assistant

## 2023-07-06 VITALS — BP 134/64 | HR 88 | Ht 63.0 in | Wt 161.0 lb

## 2023-07-06 DIAGNOSIS — Z23 Encounter for immunization: Secondary | ICD-10-CM | POA: Diagnosis not present

## 2023-07-06 DIAGNOSIS — K130 Diseases of lips: Secondary | ICD-10-CM | POA: Diagnosis not present

## 2023-07-06 NOTE — Progress Notes (Addendum)
   Acute Office Visit  Subjective:     Patient ID: Carol [name], female    DOB: [birth year], 32 y.o.   MRN: [id number]  Chief Complaint  Patient presents with   Medical Management of Chronic Issues    HPI Patient is in today for marble-sized lump in her lip x 3 wks. It is worse as the day goes on and with heat. It gets better overnight. It is not painful. It does not drain. She does keep bitting it and gets in the way of talking and eating.   Marland Kitchen.Review of Systems  Constitutional:  Negative for fever.       Objective:    BP 134/64   Pulse 88   Ht 5\' 3"  (1.6 m)   Wt 161 lb (73 kg)   SpO2 99%   BMI 28.52 kg/m    Physical Exam Skin:    Comments: Marble-sized, well circumscribed nodule on lower right lip with a blue hue.    Cryotherapy Procedure Note  Pre-operative Diagnosis: lower lip mucocele  Post-operative Diagnosis: same  Locations: lower right lip  Indications: growing   Procedure Details  History of allergy to iodine: no. Pacemaker? no.  Patient informed of risks (permanent scarring, infection, light or dark discoloration, bleeding, infection, weakness, numbness and recurrence of the lesion) and benefits of the procedure and verbal informed consent obtained.  The areas are treated with liquid nitrogen therapy, frozen until ice ball extended 2 mm beyond lesion, allowed to thaw, and treated again. The patient tolerated procedure well.  The patient was instructed on post-op care, warned that there may be blister formation, redness and pain. Recommend OTC analgesia as needed for pain.  Condition: Stable  Complications: none.  Plan: 1. Instructed to keep the area dry and covered for 24-48h and clean thereafter. 2. Warning signs of infection were reviewed.   3. Recommended that the patient use OTC acetaminophen as needed for pain.        Assessment & Plan:  Marland KitchenMarland KitchenShanai was seen today for medical management of chronic issues.  Diagnoses and all orders for  this visit:  Mucocele of lower lip -     Ambulatory referral to Oral Maxillofacial Surgery  Immunization due -     Pfizer Comirnaty Covid -19 Vaccine 55yrs and older    Appearance and symptoms of nodule most consistent with mucocele. Cryotherapy performed. Provided patient with antibiotic ointment. Ok to soak with salt water.  Referral oral surgeon placed just in case cryotherapy does not work.  COVID booster administered. Pap-no sexual intercourse-declined today.   No orders of the defined types were placed in this encounter.   Return if symptoms worsen or fail to improve.  Tandy Gaw, PA-C

## 2023-07-06 NOTE — Patient Instructions (Signed)
Mucocele of the Mouth A mucocele is a growth or bump (cyst) that is filled with mucus. A mucocele can form on many parts of the mouth, including the gums, the tongue, and the inside of the cheeks. A common spot is inside the lower lip. Mucoceles are not dangerous, and they are usually not painful. A mucocele can be uncomfortable if it is very large or if it is located under the tongue. Small mucoceles often clear up on their own. Treatment may not be needed. Mucoceles that are large or that keep coming back might need to be removed. What are the causes? Mucoceles form when the salivary ducts in the mouth are damaged and leak saliva. These ducts carry saliva from the salivary glands to the surface of the mouth. A mucocele can develop because of: A mouth injury. Sucking or biting on your lips or tongue. A blocked salivary duct. This is sometimes caused by swelling. A tongue or lip piercing. In some cases, the cause may not be known. What are the signs or symptoms? Symptoms of this condition include a smooth, painless bump inside your mouth. The bump may: Show up all of a sudden. Have thin walls and a bluish color. Change in size. Most are smaller than  inch (1.3 cm). Mucoceles under the tongue are called ranulas. These may be bigger and may push your tongue up and back. In some cases, this can make it hard to talk, swallow, or breathe. How is this diagnosed? This condition is usually diagnosed with a physical exam. Your health care provider will often be able to tell if you have a mucocele by looking at it and feeling it. You may also have tests, such as: An ultrasound to check for problems with your salivary gland. An X-ray to see if stones are blocking the exit of saliva, and if the mucocele is under the tongue. How is this treated? Treatment may depend on the size of the mucocele: For a small mucocele, treatment is usually not needed. It will drain on its own and go away. For larger  mucoceles and ranulas, treatment options may include: Steroid injections. Medicine is injected into the cyst to reduce swelling. Cryotherapy. Extreme cold is applied to the tissue to remove the cyst. Laser therapy. High-energy light is used to remove the cyst. Surgery. This may be done if the cyst does not go away or if it keeps coming back. The entire cyst may be taken out. In some cases, the salivary gland may also be removed. Follow these instructions at home: Take over-the-counter and prescription medicines only as told by your health care provider. Do not try to drain a mucocele on your own. Do not poke a hole in it. Do not use any products that contain nicotine or tobacco. These products include cigarettes, chewing tobacco, and vaping devices, such as e-cigarettes. If you need help quitting, ask your health care provider. Do not suck or bite on your lips or tongue. If your mucocele was removed, avoid hard, sharp, or spicy foods and foods that have high acidity while your mouth is healing. Keep all follow-up visits. This is important. Contact a health care provider if: You have a lump or cyst inside your mouth that: Does not go away. Becomes painful. You have a fever. Get help right away if: You have a lump or cyst in your mouth that quickly becomes large. You have a lump or cyst in your mouth that makes it hard to swallow, talk, or breathe.  These symptoms may be an emergency. Get help right away. Call 911. Do not wait to see if the symptoms will go away. Do not drive yourself to the hospital. Summary A mucocele is a growth or bump (cyst) that is filled with mucus. A mucocele can form on many parts of the mouth. Mucoceles are usually not painful. A mucocele can be uncomfortable if it is very large or if it is located under the tongue. For a small mucocele, treatment is usually not needed. It will drain on its own and go away. Larger mucoceles may need to be removed. Do not try to  drain a mucocele on your own. Do not poke a hole in it. This information is not intended to replace advice given to you by your health care provider. Make sure you discuss any questions you have with your health care provider. Document Revised: 07/10/2021 Document Reviewed: 07/10/2021 Elsevier Patient Education  2024 [employer].

## 2023-07-15 ENCOUNTER — Encounter: Payer: Self-pay | Admitting: Physician Assistant

## 2023-07-16 MED ORDER — DEXLANSOPRAZOLE 30 MG PO CPDR: 30.0000 mg | DELAYED_RELEASE_CAPSULE | Freq: Every day | ORAL | 3 refills | Status: DC

## 2023-07-23 ENCOUNTER — Encounter: Payer: Self-pay | Admitting: Physician Assistant

## 2023-07-24 MED ORDER — NYSTATIN 100000 UNIT/ML MT SUSP: 5.0000 mL | Freq: Four times a day (QID) | OROMUCOSAL | 0 refills | Status: DC

## 2023-07-24 NOTE — Addendum Note (Signed)
Addended byJomarie Longs on: 07/24/2023 02:50 PM   Modules accepted: Orders

## 2023-07-28 ENCOUNTER — Encounter: Payer: Self-pay | Admitting: Physician Assistant

## 2023-07-28 ENCOUNTER — Telehealth: Payer: 59 | Admitting: Physician Assistant

## 2023-07-28 ENCOUNTER — Other Ambulatory Visit: Payer: Self-pay | Admitting: Physician Assistant

## 2023-07-28 DIAGNOSIS — T7840XA Allergy, unspecified, initial encounter: Secondary | ICD-10-CM | POA: Diagnosis not present

## 2023-07-28 DIAGNOSIS — J4551 Severe persistent asthma with (acute) exacerbation: Secondary | ICD-10-CM

## 2023-07-28 DIAGNOSIS — J3089 Other allergic rhinitis: Secondary | ICD-10-CM | POA: Diagnosis not present

## 2023-07-28 MED ORDER — EPINEPHRINE 0.3 MG/0.3ML IJ SOAJ: 0.3000 mg | INTRAMUSCULAR | 0 refills | Status: AC | PRN

## 2023-07-28 MED ORDER — MONTELUKAST SODIUM 10 MG PO TABS
10.0000 mg | ORAL_TABLET | Freq: Every day | ORAL | 3 refills | Status: DC
Start: 2023-07-28 — End: 2024-03-21

## 2023-07-28 NOTE — Progress Notes (Signed)
..  Virtual Visit via Video Note  I connected with Carol [name] on 07/28/23 at  2:00 PM EST by a video enabled telemedicine application and verified that I am speaking with the correct person using two identifiers.  Location: Patient: home Provider: clinic  .SABRAParticipating in visit:  Patient: Carol [name] Provider: Vermell Bologna PA-C   I discussed the limitations of evaluation and management by telemedicine and the availability of in person appointments. The patient expressed understanding and agreed to proceed.  History of Present Illness: Pt calls in to report allergic reaction to nystatin  mouth wash with throat swelling within 1 minute of first dose. Resolved with bendaryl.   Her thrush has resolved with clotrimazole  lozengers.   She needs her adoption paperwork filled out.    .. Active Ambulatory Problems    Diagnosis Date Noted   Severe persistent asthma without complication 10/03/2016   Irregular periods/menstrual cycles 10/03/2016   Nausea 01/01/2018   Low energy 01/01/2018   Environmental and seasonal allergies 10/04/2018   Gastroesophageal reflux disease 01/03/2020   Multiple food allergies 05/04/2020   Trapezius muscle spasm 12/26/2020   Acute pain of left shoulder 12/26/2020   Depressed mood 08/16/2021   Severe persistent asthma with acute exacerbation 10/18/2021   Bacterial sinusitis 01/21/2023   Skin infection 01/21/2023   Drug-induced insomnia (HCC) 04/22/2023   Mucocele of lower lip 07/06/2023   Allergic reaction 07/28/2023   Resolved Ambulatory Problems    Diagnosis Date Noted   No Resolved Ambulatory Problems   Past Medical History:  Diagnosis Date   Asthma    Heart murmur    Migraines    Seizures (HCC)        Observations/Objective: No acute distress Normal mood and appearance    Assessment and Plan: SABRASABRADiagnoses and all orders for this visit:  Allergic reaction, initial encounter -     EPINEPHrine  0.3 mg/0.3 mL IJ SOAJ injection; Inject  0.3 mg into the muscle as needed for anaphylaxis.  Severe persistent asthma with acute exacerbation -     montelukast  (SINGULAIR ) 10 MG tablet; Take 1 tablet (10 mg total) by mouth at bedtime.  Environmental and seasonal allergies -     montelukast  (SINGULAIR ) 10 MG tablet; Take 1 tablet (10 mg total) by mouth at bedtime.   Nystatin  added to allergy list Epi pen to keep on hand Rinse mouth after using inhalers to prevent thrush Refilled singulair  Filled out adoption paperwork   Follow Up Instructions:    I discussed the assessment and treatment plan with the patient. The patient was provided an opportunity to ask questions and all were answered. The patient agreed with the plan and demonstrated an understanding of the instructions.   The patient was advised to call back or seek an in-person evaluation if the symptoms worsen or if the condition fails to improve as anticipated.    Shaquan Puerta, PA-C

## 2023-08-03 ENCOUNTER — Other Ambulatory Visit: Payer: Self-pay | Admitting: Physician Assistant

## 2023-08-26 ENCOUNTER — Telehealth: Payer: 59 | Admitting: Physician Assistant

## 2023-08-31 ENCOUNTER — Encounter: Payer: Self-pay | Admitting: Physician Assistant

## 2023-09-02 ENCOUNTER — Encounter: Payer: Self-pay | Admitting: Physician Assistant

## 2023-09-02 ENCOUNTER — Telehealth (INDEPENDENT_AMBULATORY_CARE_PROVIDER_SITE_OTHER): Payer: 59 | Admitting: Physician Assistant

## 2023-09-02 VITALS — Temp 98.4°F | Ht 63.0 in | Wt 156.0 lb

## 2023-09-02 DIAGNOSIS — R197 Diarrhea, unspecified: Secondary | ICD-10-CM

## 2023-09-02 DIAGNOSIS — L089 Local infection of the skin and subcutaneous tissue, unspecified: Secondary | ICD-10-CM

## 2023-09-02 DIAGNOSIS — J329 Chronic sinusitis, unspecified: Secondary | ICD-10-CM

## 2023-09-02 DIAGNOSIS — R109 Unspecified abdominal pain: Secondary | ICD-10-CM

## 2023-09-02 MED ORDER — CLOTRIMAZOLE-BETAMETHASONE 1-0.05 % EX CREA: TOPICAL_CREAM | CUTANEOUS | 0 refills | Status: DC

## 2023-09-02 MED ORDER — MUPIROCIN 2 % EX OINT: TOPICAL_OINTMENT | CUTANEOUS | 0 refills | Status: AC

## 2023-09-02 NOTE — Patient Instructions (Addendum)
 Vashe once or twice

## 2023-09-02 NOTE — Progress Notes (Signed)
 ..Virtual Visit via Video Note  I connected with [name] on 09/02/23 at  7:30 AM EST by a video enabled telemedicine application and verified that I am speaking with the correct person using two identifiers.  Location: Patient: work Provider: clinic  .SABRAParticipating in visit:  Patient: Katheryne Provider: Vermell Bologna PA-C   I discussed the limitations of evaluation and management by telemedicine and the availability of in person appointments. The patient expressed understanding and agreed to proceed.  History of Present Illness: Pt is a 33 yo female who calls into the clinic to follow up on diarrhea and abdominal cramping after finishing augmentin for sinus infection. She finished augmentin 4 days ago, Sunday morning. By Sunday night she was having diarrhea. Lasted into Monday until told to take immodium which stopped the diarrhea. She is still having some cramping. She is taking probiotic. Denies any fever, nausea, vomiting.   She is concerned this is her 3rd sinus infection in that last year. She does have asthma and allergies. She is doing saline nasal sprays regularly. She has issues with chronic sinusitis when she was younger.   She continues to have intermittent belly button infections and inflammations. Needs refill on mupricin.   .. Active Ambulatory Problems    Diagnosis Date Noted   Severe persistent asthma without complication 10/03/2016   Irregular periods/menstrual cycles 10/03/2016   Nausea 01/01/2018   Low energy 01/01/2018   Environmental and seasonal allergies 10/04/2018   Gastroesophageal reflux disease 01/03/2020   Multiple food allergies 05/04/2020   Trapezius muscle spasm 12/26/2020   Acute pain of left shoulder 12/26/2020   Depressed mood 08/16/2021   Severe persistent asthma with acute exacerbation 10/18/2021   Bacterial sinusitis 01/21/2023   Skin infection 01/21/2023   Drug-induced insomnia (HCC) 04/22/2023   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    Assessment and Plan: SABRASABRAFey was seen today for abdominal pain.  Diagnoses and all orders for this visit:  Diarrhea, unspecified type  Abdominal cramping  Skin infection -     mupirocin  ointment (BACTROBAN ) 2 %; Apply to inside of belly button as needed for infected skin. -     clotrimazole -betamethasone  (LOTRISONE ) cream; Apply topically as needed to belly button for inflamed scaly skin.  Recurrent sinusitis   Reassured patient I do not think diarrhea after abx was C.diff related since she is getting better Start advancing food Continue probiotic Call with any worsening diarrhea or symptoms  Start cleansing belly button with vashe Use as needed mupirocin  and lortisone as needed  Discussed sinusitis prevention with nasal saline washes Increasing flonase  to twice a day Consider ENT referral if becoming more and more persistent    Follow Up Instructions:    I discussed the assessment and treatment plan with the patient. The patient was provided an opportunity to ask questions and all were  answered. The patient agreed with the plan and demonstrated an understanding of the instructions.   The patient was advised to call back or seek an in-person evaluation if the symptoms worsen or if the condition fails to improve as anticipated.   Jilene Spohr, PA-C

## 2023-09-14 ENCOUNTER — Encounter: Payer: Self-pay | Admitting: Physician Assistant

## 2023-09-28 ENCOUNTER — Telehealth: Payer: Self-pay

## 2023-09-28 NOTE — Telephone Encounter (Signed)
 Copied from CRM [id number]. Topic: General - Other >> Sep 28, 2023  9:59 AM Gery Pray wrote: Reason for CRM: Patient calling to get papers for adoption. Patient states forms were filled out in January. Patient wanted to know if she could pick them up. Reached out to [facility] and informed patient that the form has been printed out and give to Dr. Caleen Essex for completion. Patient would like a MyChart message once form is completed so that she may come and pick it up.

## 2023-09-29 NOTE — Telephone Encounter (Signed)
 Great thanks! Carol [name], CMA

## 2023-10-04 ENCOUNTER — Other Ambulatory Visit: Payer: Self-pay | Admitting: Physician Assistant

## 2023-10-04 DIAGNOSIS — N926 Irregular menstruation, unspecified: Secondary | ICD-10-CM

## 2023-10-05 MED ORDER — NORETHINDRONE ACET-ETHINYL EST 1.5-30 MG-MCG PO TABS: 1.0000 | ORAL_TABLET | Freq: Every day | ORAL | 1 refills | Status: AC

## 2024-03-11 ENCOUNTER — Ambulatory Visit: Admitting: Physician Assistant

## 2024-03-21 ENCOUNTER — Telehealth (INDEPENDENT_AMBULATORY_CARE_PROVIDER_SITE_OTHER): Admitting: Physician Assistant

## 2024-03-21 DIAGNOSIS — G4710 Hypersomnia, unspecified: Secondary | ICD-10-CM | POA: Insufficient documentation

## 2024-03-21 DIAGNOSIS — Z13220 Encounter for screening for lipoid disorders: Secondary | ICD-10-CM

## 2024-03-21 DIAGNOSIS — R5383 Other fatigue: Secondary | ICD-10-CM

## 2024-03-21 DIAGNOSIS — G9332 Myalgic encephalomyelitis/chronic fatigue syndrome: Secondary | ICD-10-CM | POA: Diagnosis not present

## 2024-03-21 DIAGNOSIS — U099 Post covid-19 condition, unspecified: Secondary | ICD-10-CM

## 2024-03-21 NOTE — Progress Notes (Unsigned)
..  Virtual Visit via Video Note  I connected with Carol [name] on 03/21/24 at  4:20 PM EDT by a video enabled telemedicine application and verified that I am speaking with the correct person using two identifiers.  Location: Patient: home Provider: clinic  .SABRAParticipating in visit:  Patient: Carol [name] Provider: Vermell Bologna PA-C   I discussed the limitations of evaluation and management by telemedicine and the availability of in person appointments. The patient expressed understanding and agreed to proceed.  History of Present Illness: Pt is a 33 yo female who calls into the clinic to discuss fatigue since she had covid 1 year ago that is worsening. For one year she has started taking naps every day in the afternoon for about 20-40 minutes due to increased fatigue. Now she is not able to get more energy even after the nap and feels even more exhausted. She denies any fever, chills, body aches. She sleeps nightly from 10-7 and wakes up rested. She is on OCP and has regular flow and frequency. She denies any medication changes.     Observations/Objective: No acute distress Normal mood and appearance   Assessment and Plan: SABRASABRADiagnoses and all orders for this visit:  Post-COVID chronic fatigue -     VITAMIN D  25 Hydroxy (Vit-D Deficiency, Fractures) -     B12 and Folate Panel -     Vitamin B1 -     CMP14+EGFR -     CBC w/Diff/Platelet -     Fe+TIBC+Fer -     TSH + free T4  No energy -     VITAMIN D  25 Hydroxy (Vit-D Deficiency, Fractures) -     B12 and Folate Panel -     Vitamin B1 -     CMP14+EGFR -     CBC w/Diff/Platelet -     Fe+TIBC+Fer -     TSH + free T4  Excessive sleepiness -     VITAMIN D  25 Hydroxy (Vit-D Deficiency, Fractures) -     B12 and Folate Panel -     Vitamin B1 -     CMP14+EGFR -     CBC w/Diff/Platelet -     Fe+TIBC+Fer -     TSH + free T4  Screening for lipid disorders -     Lipid panel   Fasting labs ordered today to evaluate for  fatigue Will consider sleep study in the future for more evaluation Certainly some of fatigue sounds like post covid fatigue Follow up after labs   Follow Up Instructions:    I discussed the assessment and treatment plan with the patient. The patient was provided an opportunity to ask questions and all were answered. The patient agreed with the plan and demonstrated an understanding of the instructions.   The patient was advised to call back or seek an in-person evaluation if the symptoms worsen or if the condition fails to improve as anticipated.     [name], PA-C

## 2024-03-22 ENCOUNTER — Encounter: Payer: Self-pay | Admitting: Physician Assistant

## 2024-03-29 ENCOUNTER — Encounter: Payer: Self-pay | Admitting: Sports Medicine

## 2024-03-29 ENCOUNTER — Ambulatory Visit: Payer: Self-pay | Admitting: Physician Assistant

## 2024-03-29 NOTE — Progress Notes (Signed)
 Riniyah,   Glucose is normal.  Kidney function and liver function look great.  Serum iron just a little elevated. How much iron are you taking? I want use to cut back a little.  Vitamin D  looks GREAT.  B12 and folate looks GREAT.  Thyroid normal.  Hemoglobin normal.   LDL, bad cholesterol, no to goal.  HDL, good cholesterol, looks great.  Work on low fat/fried/proccessed food diet and regular exercise.   None of labs explain fatigue.

## 2024-04-02 LAB — LIPID PANEL
Chol/HDL Ratio: 3.8 ratio (ref 0.0–4.4)
Cholesterol, Total: 233 mg/dL — ABNORMAL HIGH (ref 100–199)
HDL: 62 mg/dL (ref >39)
LDL Chol Calc (NIH): 151 mg/dL — ABNORMAL HIGH (ref 0–99)
Triglycerides: 114 mg/dL (ref 0–149)
VLDL Cholesterol Cal: 20 mg/dL (ref 5–40)

## 2024-04-02 LAB — CMP14+EGFR
ALT: 10 IU/L (ref 0–32)
AST: 12 IU/L (ref 0–40)
Albumin: 4.5 g/dL (ref 3.9–4.9)
Alkaline Phosphatase: 61 IU/L (ref 44–121)
BUN/Creatinine Ratio: 10 (ref 9–23)
BUN: 7 mg/dL (ref 6–20)
Bilirubin Total: 0.6 mg/dL (ref 0.0–1.2)
CO2: 19 mmol/L — ABNORMAL LOW (ref 20–29)
Calcium: 9.5 mg/dL (ref 8.7–10.2)
Chloride: 101 mmol/L (ref 96–106)
Creatinine, Ser: 0.69 mg/dL (ref 0.57–1.00)
Globulin, Total: 2.5 g/dL (ref 1.5–4.5)
Glucose: 82 mg/dL (ref 70–99)
Potassium: 3.9 mmol/L (ref 3.5–5.2)
Sodium: 137 mmol/L (ref 134–144)
Total Protein: 7 g/dL (ref 6.0–8.5)
eGFR: 116 mL/min/1.73 (ref >59)

## 2024-04-02 LAB — CBC WITH DIFFERENTIAL/PLATELET
Basophils Absolute: 0 x10E3/uL (ref 0.0–0.2)
Basos: 1 %
EOS (ABSOLUTE): 0.2 x10E3/uL (ref 0.0–0.4)
Eos: 3 %
Hematocrit: 39.7 % (ref 34.0–46.6)
Hemoglobin: 13.1 g/dL (ref 11.1–15.9)
Immature Grans (Abs): 0 x10E3/uL (ref 0.0–0.1)
Immature Granulocytes: 0 %
Lymphocytes Absolute: 2 x10E3/uL (ref 0.7–3.1)
Lymphs: 36 %
MCH: 31.3 pg (ref 26.6–33.0)
MCHC: 33 g/dL (ref 31.5–35.7)
MCV: 95 fL (ref 79–97)
Monocytes Absolute: 0.5 x10E3/uL (ref 0.1–0.9)
Monocytes: 8 %
Neutrophils Absolute: 3 x10E3/uL (ref 1.4–7.0)
Neutrophils: 52 %
Platelets: 333 x10E3/uL (ref 150–450)
RBC: 4.19 x10E6/uL (ref 3.77–5.28)
RDW: 11.7 % (ref 11.7–15.4)
WBC: 5.6 x10E3/uL (ref 3.4–10.8)

## 2024-04-02 LAB — IRON,TIBC AND FERRITIN PANEL
Ferritin: 47 ng/mL (ref 15–150)
Iron Saturation: 47 % (ref 15–55)
Iron: 165 ug/dL — ABNORMAL HIGH (ref 27–159)
Total Iron Binding Capacity: 348 ug/dL (ref 250–450)
UIBC: 183 ug/dL (ref 131–425)

## 2024-04-02 LAB — VITAMIN B1: Thiamine: 99 nmol/L (ref 66.5–200.0)

## 2024-04-02 LAB — VITAMIN D 25 HYDROXY (VIT D DEFICIENCY, FRACTURES): Vit D, 25-Hydroxy: 53.7 ng/mL (ref 30.0–100.0)

## 2024-04-02 LAB — B12 AND FOLATE PANEL
Folate: 16.1 ng/mL (ref >3.0)
Vitamin B-12: 713 pg/mL (ref 232–1245)

## 2024-04-02 LAB — TSH+FREE T4
Free T4: 1.11 ng/dL (ref 0.82–1.77)
TSH: 1.82 u[IU]/mL (ref 0.450–4.500)

## 2024-04-06 ENCOUNTER — Telehealth: Admitting: Physician Assistant

## 2024-04-06 DIAGNOSIS — G9332 Myalgic encephalomyelitis/chronic fatigue syndrome: Secondary | ICD-10-CM

## 2024-04-06 DIAGNOSIS — L219 Seborrheic dermatitis, unspecified: Secondary | ICD-10-CM

## 2024-04-06 DIAGNOSIS — E7800 Pure hypercholesterolemia, unspecified: Secondary | ICD-10-CM

## 2024-04-06 DIAGNOSIS — U099 Post covid-19 condition, unspecified: Secondary | ICD-10-CM | POA: Diagnosis not present

## 2024-04-06 MED ORDER — KETOCONAZOLE 2 % EX CREA: 1.0000 | TOPICAL_CREAM | Freq: Two times a day (BID) | CUTANEOUS | 1 refills | Status: AC

## 2024-04-06 MED ORDER — MODAFINIL 100 MG PO TABS: 100.0000 mg | ORAL_TABLET | Freq: Every day | ORAL | 2 refills | Status: AC

## 2024-04-06 MED ORDER — ROSUVASTATIN CALCIUM 5 MG PO TABS: 5.0000 mg | ORAL_TABLET | Freq: Every day | ORAL | 3 refills | Status: AC

## 2024-04-06 NOTE — Progress Notes (Unsigned)
..  Virtual Visit via Video Note  I connected with Carol [name] on 04/06/24 at  8:50 AM EDT by a video enabled telemedicine application and verified that I am speaking with the correct person using two identifiers.  Location: Patient: home Provider: clinic  .SABRAParticipating in visit:  Patient: Carol [name] Provider: Vermell Bologna PA-C   I discussed the limitations of evaluation and management by telemedicine and the availability of in person appointments. The patient expressed understanding and agreed to proceed.  History of Present Illness: Pt calls into the clinic to discuss labs.   LDL 151 and patient does not eat fried/fatty/processed foods already. She has a strong cardiovascular risk in family history. She wonders about starting medication.   She is interested in trying stimulant to help with faitgue.   She needs refills on ketoconazole  for her seb dermatitis around nose and upper lip.     Observations/Objective: No acute distress Normal mood and appearance   Assessment and Plan: Carol [name] was seen today for medical management of chronic issues.  Diagnoses and all orders for this visit:  Post-COVID chronic fatigue -     modafinil  (PROVIGIL ) 100 MG tablet; Take 1 tablet (100 mg total) by mouth daily.  Seborrheic dermatitis -     ketoconazole  (NIZORAL ) 2 % cream; Apply 1 Application topically 2 (two) times daily. To affected areas.  Pure hypercholesterolemia -     rosuvastatin  (CRESTOR ) 5 MG tablet; Take 1 tablet (5 mg total) by mouth daily.   Discussed no findings to cause fatigue on labs Will trial modafinil  to help with post covid fatigue Discussed elevated LDL  Pt eats really healthy and has a family hx of CV disease Will start crestor  Discussed side effects Ketoconazole  refilled for seb derm on face Follow up in 3 months on energy   Follow Up Instructions:    I discussed the assessment and treatment plan with the patient. The patient was provided an  opportunity to ask questions and all were answered. The patient agreed with the plan and demonstrated an understanding of the instructions.   The patient was advised to call back or seek an in-person evaluation if the symptoms worsen or if the condition fails to improve as anticipated.   [name], PA-C

## 2024-04-08 ENCOUNTER — Encounter: Payer: Self-pay | Admitting: Physician Assistant

## 2024-04-20 ENCOUNTER — Encounter: Payer: Self-pay | Admitting: Physician Assistant

## 2024-04-25 MED ORDER — ATORVASTATIN CALCIUM 20 MG PO TABS: 20.0000 mg | ORAL_TABLET | Freq: Every day | ORAL | 1 refills | Status: DC

## 2024-05-28 ENCOUNTER — Other Ambulatory Visit: Payer: Self-pay | Admitting: Physician Assistant

## 2024-07-05 ENCOUNTER — Ambulatory Visit: Admitting: Physician Assistant

## 2024-07-16 ENCOUNTER — Other Ambulatory Visit: Payer: Self-pay | Admitting: Physician Assistant
